# Patient Record
Sex: Female | Born: 1970 | Race: Black or African American | Hispanic: No | Marital: Married | Smoking: Never smoker
Health system: Southern US, Community
[De-identification: ages and names within clinical notes are randomized; demographics above are authoritative.]

## PROBLEM LIST (undated history)

## (undated) DIAGNOSIS — E669 Obesity, unspecified: Secondary | ICD-10-CM

## (undated) DIAGNOSIS — R609 Edema, unspecified: Secondary | ICD-10-CM

## (undated) DIAGNOSIS — E785 Hyperlipidemia, unspecified: Secondary | ICD-10-CM

## (undated) DIAGNOSIS — N760 Acute vaginitis: Secondary | ICD-10-CM

## (undated) DIAGNOSIS — B001 Herpesviral vesicular dermatitis: Secondary | ICD-10-CM

## (undated) DIAGNOSIS — R1013 Epigastric pain: Secondary | ICD-10-CM

## (undated) DIAGNOSIS — K219 Gastro-esophageal reflux disease without esophagitis: Secondary | ICD-10-CM

## (undated) DIAGNOSIS — A6000 Herpesviral infection of urogenital system, unspecified: Secondary | ICD-10-CM

## (undated) HISTORY — DX: Hyperlipidemia, unspecified: E78.5

## (undated) HISTORY — DX: Epigastric pain: R10.13

## (undated) HISTORY — DX: Edema, unspecified: R60.9

## (undated) HISTORY — DX: Herpesviral infection of urogenital system, unspecified: A60.00

## (undated) HISTORY — DX: Acute vaginitis: N76.0

## (undated) HISTORY — PX: WISDOM TOOTH EXTRACTION: SHX21

## (undated) HISTORY — DX: Obesity, unspecified: E66.9

## (undated) HISTORY — DX: Herpesviral vesicular dermatitis: B00.1

---

## 2003-10-02 HISTORY — PX: BREAST BIOPSY: SHX20

## 2005-01-05 ENCOUNTER — Ambulatory Visit: Payer: Self-pay | Admitting: General Surgery

## 2005-12-25 ENCOUNTER — Ambulatory Visit: Payer: Self-pay

## 2007-11-30 HISTORY — PX: OTHER SURGICAL HISTORY: SHX169

## 2007-12-04 ENCOUNTER — Ambulatory Visit: Payer: Self-pay | Admitting: Unknown Physician Specialty

## 2009-10-05 ENCOUNTER — Ambulatory Visit: Payer: Self-pay | Admitting: Unknown Physician Specialty

## 2010-12-21 ENCOUNTER — Ambulatory Visit: Payer: Self-pay | Admitting: Unknown Physician Specialty

## 2012-01-03 ENCOUNTER — Ambulatory Visit: Payer: Self-pay

## 2013-02-24 ENCOUNTER — Ambulatory Visit: Payer: Self-pay

## 2014-03-03 ENCOUNTER — Ambulatory Visit: Payer: Self-pay

## 2015-02-15 ENCOUNTER — Other Ambulatory Visit: Payer: Self-pay | Admitting: Certified Nurse Midwife

## 2015-02-15 DIAGNOSIS — Z1231 Encounter for screening mammogram for malignant neoplasm of breast: Secondary | ICD-10-CM

## 2015-03-08 ENCOUNTER — Ambulatory Visit
Admission: RE | Admit: 2015-03-08 | Discharge: 2015-03-08 | Disposition: A | Discharge status: Home / Self Care | Payer: BC Managed Care – PPO | Source: Ambulatory Visit | Attending: Certified Nurse Midwife | Admitting: Certified Nurse Midwife

## 2015-03-08 DIAGNOSIS — Z1231 Encounter for screening mammogram for malignant neoplasm of breast: Secondary | ICD-10-CM | POA: Insufficient documentation

## 2016-04-13 LAB — HM PAP SMEAR: HM Pap smear: NEGATIVE

## 2016-04-16 ENCOUNTER — Other Ambulatory Visit: Payer: Self-pay | Admitting: Certified Nurse Midwife

## 2016-04-16 DIAGNOSIS — Z1231 Encounter for screening mammogram for malignant neoplasm of breast: Secondary | ICD-10-CM

## 2016-05-04 ENCOUNTER — Ambulatory Visit: Payer: BC Managed Care – PPO

## 2016-05-11 ENCOUNTER — Other Ambulatory Visit: Payer: Self-pay | Admitting: Certified Nurse Midwife

## 2016-05-11 ENCOUNTER — Ambulatory Visit
Admission: RE | Admit: 2016-05-11 | Discharge: 2016-05-11 | Disposition: A | Discharge status: Home / Self Care | Payer: BC Managed Care – PPO | Source: Ambulatory Visit | Attending: Certified Nurse Midwife | Admitting: Certified Nurse Midwife

## 2016-05-11 DIAGNOSIS — Z1231 Encounter for screening mammogram for malignant neoplasm of breast: Secondary | ICD-10-CM | POA: Diagnosis not present

## 2016-05-11 LAB — HM MAMMOGRAPHY

## 2017-04-26 ENCOUNTER — Encounter: Payer: Self-pay | Admitting: Certified Nurse Midwife

## 2017-04-26 ENCOUNTER — Ambulatory Visit: Payer: Self-pay | Admitting: Certified Nurse Midwife

## 2017-05-02 ENCOUNTER — Other Ambulatory Visit: Payer: Self-pay | Admitting: Certified Nurse Midwife

## 2017-05-02 DIAGNOSIS — Z1231 Encounter for screening mammogram for malignant neoplasm of breast: Secondary | ICD-10-CM

## 2017-05-03 ENCOUNTER — Encounter: Payer: Self-pay | Admitting: Certified Nurse Midwife

## 2017-05-03 ENCOUNTER — Ambulatory Visit (INDEPENDENT_AMBULATORY_CARE_PROVIDER_SITE_OTHER): Payer: BC Managed Care – PPO | Admitting: Certified Nurse Midwife

## 2017-05-03 ENCOUNTER — Ambulatory Visit: Payer: Self-pay | Admitting: Certified Nurse Midwife

## 2017-05-03 VITALS — BP 100/64 | HR 84 | Ht 67.0 in | Wt 205.0 lb

## 2017-05-03 DIAGNOSIS — L292 Pruritus vulvae: Secondary | ICD-10-CM | POA: Diagnosis not present

## 2017-05-03 DIAGNOSIS — R1013 Epigastric pain: Secondary | ICD-10-CM

## 2017-05-03 DIAGNOSIS — Z01419 Encounter for gynecological examination (general) (routine) without abnormal findings: Secondary | ICD-10-CM

## 2017-05-03 DIAGNOSIS — Z1231 Encounter for screening mammogram for malignant neoplasm of breast: Secondary | ICD-10-CM | POA: Diagnosis not present

## 2017-05-03 DIAGNOSIS — Z1239 Encounter for other screening for malignant neoplasm of breast: Secondary | ICD-10-CM

## 2017-05-03 DIAGNOSIS — Z1211 Encounter for screening for malignant neoplasm of colon: Secondary | ICD-10-CM | POA: Diagnosis not present

## 2017-05-03 DIAGNOSIS — N912 Amenorrhea, unspecified: Secondary | ICD-10-CM

## 2017-05-03 NOTE — Progress Notes (Signed)
Gynecology Annual Exam  PCP: Dr Ramonita Lab Chief Complaint:  Chief Complaint  Patient presents with  . Gynecologic Exam    History of Present Illness:Roberta Sanchez is a 46 year old African American/Black female, G0 P0000, who presents for her annual exam. She has problems with frequent vaginitis sx. She used boric acid capsules about three times this past year with relief of symptoms. Last used boric acid this week. She would like a refill of Mycolog II to use externally and boric acid. Her menses have been mostly absent on Ocella but she had some spotting last month.  Having some hot flashes. Last  [facility] and LH in 2014 were WNL.   The patient's past medical history is notable for a history of obesity, hyperlipidemia, edema, and chronic vaginitis. Her PCP is Dr Caryl Comes. Since her last annual GYN exam dated 04/13/2016, she has lost 23# by counting calories. Her current BMI=32.11 kg/m2 She is sexually active. She is currently using birth control pills for contraception.  Her most recent pap smear was obtained 04/13/2016 and was negative.  Her most recent mammogram obtained on 05/11/2016 was normal.  There is no family history of breast cancer.  There is no family history of ovarian cancer.  The patient does do monthly self breast exams.  The patient does not smoke.  The patient does not drink alcohol.  The patient does not use illegal drugs.  The patient does exercise by walking The patient does not get adequate calcium in her diet.  She had a recent cholesterol screen in 2017 that was normal.     The patient denies current symptoms of depression.    Review of Systems: Review of Systems  Constitutional: Positive for weight loss (intentional). Negative for chills and fever.  HENT: Negative for congestion, sinus pain and sore throat.   Eyes: Negative for blurred vision and pain.  Respiratory: Negative for hemoptysis, shortness of breath and wheezing.   Cardiovascular: Negative  for chest pain, palpitations and leg swelling.  Gastrointestinal: Positive for heartburn. Negative for abdominal pain, blood in stool, diarrhea, nausea and vomiting.  Genitourinary: Negative for dysuria, frequency, hematuria and urgency.       Positive for amenorrhea  Musculoskeletal: Negative for back pain, joint pain and myalgias.  Skin: Negative for itching and rash.  Neurological: Negative for dizziness, tingling and headaches.  Endo/Heme/Allergies: Negative for environmental allergies and polydipsia. Does not bruise/bleed easily.       Negative for hirsutism   Psychiatric/Behavioral: Negative for depression. The patient is not nervous/anxious and does not have insomnia.     Past Medical History:  Past Medical History:  Diagnosis Date  . Dyspepsia   . Edema   . Hyperlipidemia   . Obesity   . Vulvovaginitis    recurrent monolial vv    Past Surgical History:  Past Surgical History:  Procedure Laterality Date  . BREAST BIOPSY Right 2005   Core Bx -fibroadenoma/ Dr Bary Castilla  . FNA of breast Right 11/2007   benign    Family History:  Family History  Problem Relation Age of Onset  . Hypertension Mother   . Prostate cancer Maternal Uncle   . Breast cancer Neg Hx   . Ovarian cancer Neg Hx   . Diabetes Neg Hx     Social History:  Social History   Social History  . Marital status: Married    Spouse name: N/A  . Number of children: 0  . Years of education:  14   Occupational History  . Patient Coordinator/ [employer]    Social History Main Topics  . Smoking status: Never Smoker  . Smokeless tobacco: Never Used  . Alcohol use No  . Drug use: No  . Sexual activity: Yes    Partners: Male    Birth control/ protection: OCP   Other Topics Concern  . Not on file   Social History Narrative  . No narrative on file    Allergies:  No Known Allergies  Medications:  Current Outpatient Prescriptions:  .  calcium-vitamin D (OSCAL WITH D) 500-200 MG-UNIT tablet, Take  1 tablet by mouth daily with breakfast., Disp: , Rfl:  .  Cyanocobalamin (VITAMIN B 12 PO), Take 500 mcg by mouth daily. , Disp: , Rfl:  .  drospirenone-ethinyl estradiol (YASMIN,ZARAH,SYEDA) 3-0.03 MG tablet, Take by mouth., Disp: , Rfl:  .  hydrochlorothiazide (HYDRODIURIL) 12.5 MG tablet, , Disp: , Rfl:  .  loratadine (CLARITIN) 10 MG tablet, Take 10 mg by mouth daily., Disp: , Rfl:  .  Multiple Vitamin (MULTI-VITAMINS) TABS, Take by mouth., Disp: , Rfl:  .  potassium chloride SA (K-DUR,KLOR-CON) 20 MEQ tablet, Take 10 mEq by mouth daily. , Disp: , Rfl:  Physical Exam Vitals: BP 100/64 (BP Location: Left Arm, Patient Position: Sitting, Cuff Size: Large)   Pulse 84   Ht 5\' 7"  (1.702 m)   Wt 205 lb (93 kg)   BMI 32.11 kg/m   General: BF in NAD HEENT: normocephalic, anicteric Neck: no thyroid enlargement, no palpable nodules, no cervical lymphadenopathy  Pulmonary: No increased work of breathing, CTAB Cardiovascular: RRR, without murmur  Breast: Breast symmetrical, no tenderness, no palpable nodules or masses, no skin or nipple retraction present, no nipple discharge.  No axillary, infraclavicular or supraclavicular lymphadenopathy. Abdomen: Soft, non-tender, non-distended.  Umbilicus without lesions.  No hepatomegaly or masses palpable. No evidence of hernia. Genitourinary:  External: Normal external female genitalia.  Normal urethral meatus, normal Bartholin's and Skene's glands.    Vagina: Normal vaginal mucosa, no evidence of prolapse.    Cervix: Grossly normal in appearance, no bleeding, non-tender  Uterus: Anteverted, deviated to the left, normal size, shape, and consistency, decreased mobility, and non-tender  Adnexa: No adnexal masses, non-tender  Rectal: no masses. Hemoccult negative  Lymphatic: no evidence of inguinal lymphadenopathy Extremities: no edema, erythema, or tenderness Neurologic: Grossly intact Psychiatric: mood appropriate, affect full  Results for orders  placed or performed in visit on 05/03/17 (from the past 24 hour(s))  POCT Occult Blood Stool     Status: Normal   Collection Time: 05/08/17 10:33 PM  Result Value Ref Range   Fecal Occult Blood, POC Negative Negative   Card #1 Date     Card #2 Fecal Occult Blod, POC     Card #2 Date     Card #3 Fecal Occult Blood, POC     Card #3 Date    POCT Wet Prep [facility] [place])     Status: Normal   Collection Time: 05/08/17 10:34 PM  Result Value Ref Range   Source Wet Prep POC vaginal    WBC, Wet Prep HPF POC     Bacteria Wet Prep HPF POC  Few   BACTERIA WET PREP MORPHOLOGY POC     Clue Cells Wet Prep HPF POC None None   Clue Cells Wet Prep Whiff POC     Yeast Wet Prep HPF POC None    KOH Wet Prep POC     Trichomonas Wet  Prep HPF POC Absent Absent     Assessment: 46 y.o. G0P0000 annual gyn exam  Plan:  1) Breast cancer screening - recommend monthly self breast exam and annual mammogram. Mammogram was ordered today. She has a mammogram scheduled for 06/05/17  2) Colon cancer screening discussed. Offered referral for colonoscopy. Would like to do annual hemoccult testing. Test today was negative  3) Cervical cancer screening - Pap not done. ASCCP guidelines and rational discussed.  Patient opts for every 3 years screening interval  4) Contraception discussed testing FSH and LH again since she is essentially amenorrheic on Ocella BCPS. Will return when  Off pills to test. To use condoms until she restarts OCPS and has been on for 1-2 weeks. Refill of Ocella called in.  5) Routine healthcare maintenance including cholesterol and diabetes screening managed by PCP   6) NO evidence of vaginitis today. Refill of Mycolog II and boric acid called in.  Dalia Heading, CNM

## 2017-05-08 ENCOUNTER — Encounter: Payer: Self-pay | Admitting: Certified Nurse Midwife

## 2017-05-08 DIAGNOSIS — R1013 Epigastric pain: Secondary | ICD-10-CM | POA: Insufficient documentation

## 2017-05-08 DIAGNOSIS — E669 Obesity, unspecified: Secondary | ICD-10-CM | POA: Insufficient documentation

## 2017-05-08 DIAGNOSIS — E785 Hyperlipidemia, unspecified: Secondary | ICD-10-CM | POA: Insufficient documentation

## 2017-05-08 DIAGNOSIS — R609 Edema, unspecified: Secondary | ICD-10-CM | POA: Insufficient documentation

## 2017-05-08 LAB — HEMOCCULT GUIAC POC 1CARD (OFFICE): FECAL OCCULT BLD: NEGATIVE

## 2017-05-08 LAB — POCT WET PREP (WET MOUNT): Trichomonas Wet Prep HPF POC: ABSENT

## 2017-06-04 ENCOUNTER — Ambulatory Visit
Admission: RE | Admit: 2017-06-04 | Discharge: 2017-06-04 | Disposition: A | Discharge status: Home / Self Care | Payer: BC Managed Care – PPO | Source: Ambulatory Visit | Attending: Certified Nurse Midwife | Admitting: Certified Nurse Midwife

## 2017-06-04 DIAGNOSIS — Z1231 Encounter for screening mammogram for malignant neoplasm of breast: Secondary | ICD-10-CM | POA: Insufficient documentation

## 2018-01-01 ENCOUNTER — Encounter: Payer: Self-pay | Admitting: Certified Nurse Midwife

## 2018-01-01 ENCOUNTER — Ambulatory Visit: Payer: BC Managed Care – PPO | Admitting: Certified Nurse Midwife

## 2018-01-01 VITALS — BP 108/68 | HR 90 | Ht 67.0 in | Wt 200.0 lb

## 2018-01-01 DIAGNOSIS — N898 Other specified noninflammatory disorders of vagina: Secondary | ICD-10-CM | POA: Diagnosis not present

## 2018-01-01 DIAGNOSIS — N760 Acute vaginitis: Secondary | ICD-10-CM

## 2018-01-01 DIAGNOSIS — L292 Pruritus vulvae: Secondary | ICD-10-CM | POA: Diagnosis not present

## 2018-01-01 MED ORDER — CLOTRIMAZOLE-BETAMETHASONE 1-0.05 % EX CREA: 1.0000 "application " | TOPICAL_CREAM | Freq: Two times a day (BID) | CUTANEOUS | 0 refills | Status: DC

## 2018-01-01 MED ORDER — FLUCONAZOLE 150 MG PO TABS: 150.0000 mg | ORAL_TABLET | Freq: Once | ORAL | 0 refills | Status: AC

## 2018-01-06 DIAGNOSIS — N760 Acute vaginitis: Secondary | ICD-10-CM | POA: Insufficient documentation

## 2018-01-06 LAB — POCT WET PREP (WET MOUNT): TRICHOMONAS WET PREP HPF POC: ABSENT

## 2018-01-06 NOTE — Progress Notes (Signed)
Obstetrics & Gynecology Office Visit   Chief Complaint:  Chief Complaint  Patient presents with  . Vaginitis    History of Present Illness: Roberta Sanchez is a 47 year old African American/Black female, G0 P0000, who presents with vaginitis sx of vaginal discharge and vulvar itching x 2 weeks. The vulvar itching has not responded to boric acid vaginally and Mycolog II externally. She has a history of recurrent vaginitis which usually responds to these treatments. Has not been on antibiotics recently. Has used a new fabric softener on her clothes. Her current form of contraception is Junel  1/20. She is largely amenorrheic on the pill, but does have occasional spotting.    Review of Systems:  ROS -comprehensive ROS is negative except as stated in the HPI.  Past Medical History:  Past Medical History:  Diagnosis Date  . Dyspepsia   . Edema   . Hyperlipidemia   . Obesity   . Vulvovaginitis    recurrent monolial vv    Past Surgical History:  Past Surgical History:  Procedure Laterality Date  . BREAST BIOPSY Right 2005   Core Bx -fibroadenoma/ Dr Bary Castilla  . FNA of breast Right 11/2007   benign    Gynecologic History: No LMP recorded. (Menstrual status: Perimenopausal).  Obstetric History: G0P0000  Family History:  Family History  Problem Relation Age of Onset  . Hypertension Mother   . Prostate cancer Maternal Uncle   . Breast cancer Neg Hx   . Ovarian cancer Neg Hx   . Diabetes Neg Hx     Social History:  Social History   Socioeconomic History  . Marital status: Married    Spouse name: Not on file  . Number of children: 0  . Years of education: 4  . Highest education level: Not on file  Occupational History  . Occupation: Patient Coordinator/ [employer]  Social Needs  . Financial resource strain: Not on file  . Food insecurity:    Worry: Not on file    Inability: Not on file  . Transportation needs:    Medical: Not on file    Non-medical: Not on  file  Tobacco Use  . Smoking status: Never Smoker  . Smokeless tobacco: Never Used  Substance and Sexual Activity  . Alcohol use: No  . Drug use: No  . Sexual activity: Yes    Partners: Male    Birth control/protection: OCP  Lifestyle  . Physical activity:    Days per week: 0 days    Minutes per session: 0 min  . Stress: Not on file  Relationships  . Social connections:    Talks on phone: Not on file    Gets together: Not on file    Attends religious service: Not on file    Active member of club or organization: Not on file    Attends meetings of clubs or organizations: Not on file    Relationship status: Not on file  . Intimate partner violence:    Fear of current or ex partner: Not on file    Emotionally abused: Not on file    Physically abused: Not on file    Forced sexual activity: Not on file  Other Topics Concern  . Not on file  Social History Narrative  . Not on file    Allergies:  No Known Allergies  Medications: Prior to Admission medications   Medication Sig Start Date End Date Taking? Authorizing Provider  calcium-vitamin D (OSCAL WITH D) 500-200  MG-UNIT tablet Take 1 tablet by mouth daily with breakfast.   Yes [provider]  Cyanocobalamin (VITAMIN B 12 PO) Take 500 mcg by mouth daily.    Yes [provider]  hydrochlorothiazide (HYDRODIURIL) 12.5 MG tablet  04/09/17  Yes [provider]  loratadine (CLARITIN) 10 MG tablet Take 10 mg by mouth daily.   Yes [provider]  Multiple Vitamin (MULTI-VITAMINS) TABS Take by mouth.   Yes [provider]  norethindrone-ethinyl estradiol (JUNEL FE,GILDESS FE,LOESTRIN FE) 1-20 MG-MCG tablet  12/21/17  Yes [provider]  omeprazole (PRILOSEC) 20 MG capsule TAKE ONE CAPSULE BY MOUTH TWICE A DAY 07/31/17  Yes [provider]  potassium chloride SA (K-DUR,KLOR-CON) 20 MEQ tablet Take 10 mEq by mouth daily.    Yes [provider]   Physical  Exam Vitals:BP 108/68   Pulse 90   Ht 5\' 7"  (1.702 m)   Wt 200 lb (90.7 kg)   BMI 31.32 kg/m  No LMP recorded. (Menstrual status: Perimenopausal).  Physical Exam  Constitutional: She is oriented to person, place, and time. She appears well-developed and well-nourished. No distress.  Cardiovascular: Normal rate.  Respiratory: Effort normal.  Genitourinary:  Genitourinary Comments: Vulva: no lesions, inflammation, or discharge Vagina: brown discharge, no inflammation Cervix: no lesions   Neurological: She is alert and oriented to person, place, and time.  Skin: Skin is warm and dry.  Psychiatric: She has a normal mood and affect.   Wet prep negative for hyphae, Trich, clue cells  Assessment: 47 y.o. G0P0000 vulvar itching which may be due to an incompletely treated yeast infection or exposure to fabric softeners  Plan: Diflucan 150 mgm x1 Lotrisone BID topically prn itching RTO prn persistent or worsening symptoms.  Dalia Heading, CNM

## 2018-01-10 ENCOUNTER — Telehealth: Payer: Self-pay

## 2018-01-10 NOTE — Telephone Encounter (Signed)
Pt treated for YI by [place] last week. Pt states it cleared up but having symptoms/irritation and feels like it is coming back. CLG told her to call back if she was still having problems. Pt requesting a rf of Diflucan to [facility]. CH#[phone]

## 2018-01-13 NOTE — Telephone Encounter (Signed)
Please advise for refill. Thank you.  

## 2018-01-15 ENCOUNTER — Other Ambulatory Visit: Payer: Self-pay | Admitting: Certified Nurse Midwife

## 2018-01-15 MED ORDER — FLUCONAZOLE 150 MG PO TABS: 150.0000 mg | ORAL_TABLET | Freq: Once | ORAL | 0 refills | Status: AC

## 2018-01-15 NOTE — Telephone Encounter (Signed)
I called in one more Diflucan-but if her symptoms persist, will need to come back. Should still have Lotrisone for external use.

## 2018-01-15 NOTE — Telephone Encounter (Signed)
Pt aware and appreciative  

## 2018-04-28 ENCOUNTER — Other Ambulatory Visit: Payer: Self-pay | Admitting: Certified Nurse Midwife

## 2018-05-09 ENCOUNTER — Encounter: Payer: Self-pay | Admitting: Certified Nurse Midwife

## 2018-05-09 ENCOUNTER — Ambulatory Visit (INDEPENDENT_AMBULATORY_CARE_PROVIDER_SITE_OTHER): Payer: BC Managed Care – PPO | Admitting: Certified Nurse Midwife

## 2018-05-09 VITALS — BP 102/62 | HR 95 | Ht 67.0 in | Wt 199.0 lb

## 2018-05-09 DIAGNOSIS — Z01419 Encounter for gynecological examination (general) (routine) without abnormal findings: Secondary | ICD-10-CM

## 2018-05-09 DIAGNOSIS — Z1211 Encounter for screening for malignant neoplasm of colon: Secondary | ICD-10-CM

## 2018-05-09 DIAGNOSIS — Z1231 Encounter for screening mammogram for malignant neoplasm of breast: Secondary | ICD-10-CM

## 2018-05-09 DIAGNOSIS — Z1239 Encounter for other screening for malignant neoplasm of breast: Secondary | ICD-10-CM

## 2018-05-09 MED ORDER — NYSTATIN-TRIAMCINOLONE 100000-0.1 UNIT/GM-% EX OINT: 1.0000 "application " | TOPICAL_OINTMENT | Freq: Two times a day (BID) | CUTANEOUS | 0 refills | Status: DC | PRN

## 2018-05-09 MED ORDER — BORIC ACID CRYS: CRYSTALS | 5 refills | Status: DC

## 2018-05-09 MED ORDER — NYSTATIN 100000 UNIT/GM EX POWD: Freq: Every day | CUTANEOUS | Status: DC | PRN

## 2018-05-09 NOTE — Progress Notes (Signed)
Gynecology Annual Exam  PCP: Dr Ramonita Lab Chief Complaint:  Chief Complaint  Patient presents with  . Gynecologic Exam    No complaints    History of Present Illness:Roberta Sanchez is a 47 year old African American/Black female, G0 P0000, who presents for her annual exam. She has problems with frequent vaginitis sx. She usually uses boric acid in treatment of these symptoms with relief. In April she was treated for a yeast infection with Diflucan x 2 tabs with relief and has not had vaginitis symptoms since.  She would like a refill of Mycolog II to use externally and boric acid. Her menses are irregular and are lite, consisting of spotting x 1 day on OCPs.    The patient's past medical history is notable for a history of obesity, hyperlipidemia, edema, and chronic vaginitis. Her PCP is Dr Caryl Comes. Since her last annual GYN exam dated 05/03/2017 , she has lost 6 more pounds (29# in the past 2 years) by counting calories. Her current BMI=31.17 kg/m2 She is sexually active. She is currently using birth control pills for contraception.  Her most recent pap smear was obtained 04/13/2016 and was negative.  Her most recent mammogram obtained on 06/04/2017 was normal.  There is no family history of breast cancer.  There is no family history of ovarian cancer.  The patient does do monthly self breast exams.  The patient does not smoke.  The patient does not drink alcohol.  The patient does not use illegal drugs.  The patient does exercise by walking at work The patient does not get adequate calcium in her diet, but does take calcium and vitamin D3 supplements  She had a recent cholesterol screen in 2018 that was normal.    The patient denies current symptoms of depression.    Review of Systems: Review of Systems  Constitutional: Positive for weight loss (intentional). Negative for chills and fever.  HENT: Negative for congestion, sinus pain and sore throat.   Eyes: Negative for blurred  vision and pain.  Respiratory: Negative for hemoptysis, shortness of breath and wheezing.   Cardiovascular: Negative for chest pain, palpitations and leg swelling.  Gastrointestinal: Positive for heartburn. Negative for abdominal pain, blood in stool, diarrhea, nausea and vomiting.  Genitourinary: Negative for dysuria, frequency, hematuria and urgency.       Positive for amenorrhea  Musculoskeletal: Negative for back pain, joint pain and myalgias.  Skin: Negative for itching and rash.  Neurological: Negative for dizziness, tingling and headaches.  Endo/Heme/Allergies: Negative for environmental allergies and polydipsia. Does not bruise/bleed easily.       Negative for hirsutism   Psychiatric/Behavioral: Negative for depression. The patient is not nervous/anxious and does not have insomnia.     Past Medical History:  Past Medical History:  Diagnosis Date  . Dyspepsia   . Edema   . Hyperlipidemia   . Obesity   . Vulvovaginitis    recurrent monolial vv    Past Surgical History:  Past Surgical History:  Procedure Laterality Date  . BREAST BIOPSY Right 2005   Core Bx -fibroadenoma/ Dr Bary Castilla  . FNA of breast Right 11/2007   benign    Family History:  Family History  Problem Relation Age of Onset  . Hypertension Mother   . Prostate cancer Maternal Uncle   . Breast cancer Neg Hx   . Ovarian cancer Neg Hx   . Diabetes Neg Hx     Social History:  Social History  Socioeconomic History  . Marital status: Married    Spouse name: Not on file  . Number of children: 0  . Years of education: 89  . Highest education level: Not on file  Occupational History  . Occupation: Patient Coordinator/ [employer]  Social Needs  . Financial resource strain: Not on file  . Food insecurity:    Worry: Not on file    Inability: Not on file  . Transportation needs:    Medical: Not on file    Non-medical: Not on file  Tobacco Use  . Smoking status: Never Smoker  . Smokeless tobacco:  Never Used  Substance and Sexual Activity  . Alcohol use: No  . Drug use: No  . Sexual activity: Yes    Partners: Male    Birth control/protection: OCP  Lifestyle  . Physical activity:    Days per week: 0 days    Minutes per session: 0 min  . Stress: Not on file  Relationships  . Social connections:    Talks on phone: Not on file    Gets together: Not on file    Attends religious service: Not on file    Active member of club or organization: Not on file    Attends meetings of clubs or organizations: Not on file    Relationship status: Not on file  . Intimate partner violence:    Fear of current or ex partner: Not on file    Emotionally abused: Not on file    Physically abused: Not on file    Forced sexual activity: Not on file  Other Topics Concern  . Not on file  Social History Narrative  . Not on file    Allergies:  No Known Allergies  Medications:  Current Outpatient Medications:  .  calcium-vitamin D (OSCAL WITH D) 500-200 MG-UNIT tablet, Take 1 tablet by mouth daily with breakfast., Disp: , Rfl:  .  clotrimazole-betamethasone (LOTRISONE) cream, Apply 1 application topically 2 (two) times daily., Disp: 30 g, Rfl: 0 .  Cyanocobalamin (VITAMIN B 12 PO), Take 500 mcg by mouth daily. , Disp: , Rfl:  .  hydrochlorothiazide (HYDRODIURIL) 12.5 MG tablet, , Disp: , Rfl:  .  loratadine (CLARITIN) 10 MG tablet, Take 10 mg by mouth daily., Disp: , Rfl:  .  Multiple Vitamin (MULTI-VITAMINS) TABS, Take by mouth., Disp: , Rfl:  .  norethindrone-ethinyl estradiol (JUNEL FE,GILDESS FE,LOESTRIN FE) 1-20 MG-MCG tablet, , Disp: , Rfl:  .  omeprazole (PRILOSEC) 20 MG capsule, TAKE ONE CAPSULE BY MOUTH TWICE A DAY, Disp: , Rfl:  .  potassium chloride SA (K-DUR,KLOR-CON) 20 MEQ tablet, Take 10 mEq by mouth daily. , Disp: , Rfl:  Physical Exam Vitals: BP 102/62 (BP Location: Right Arm, Patient Position: Sitting, Cuff Size: Normal)   Pulse 95   Ht 5\' 7"  (1.702 m)   Wt 199 lb (90.3 kg)    BMI 31.17 kg/m   General: BF in NAD HEENT: normocephalic, anicteric Neck: no thyroid enlargement, no palpable nodules, no cervical lymphadenopathy  Pulmonary: No increased work of breathing, CTAB Cardiovascular: RRR, without murmur  Breast: Breast symmetrical, no tenderness, no palpable nodules or masses, no skin or nipple retraction present, no nipple discharge.  No axillary, infraclavicular or supraclavicular lymphadenopathy. Abdomen: Soft, non-tender, non-distended.  Umbilicus without lesions.  No hepatomegaly or masses palpable. No evidence of hernia. Genitourinary:  External: Normal external female genitalia.  Normal urethral meatus, normal Bartholin's and Skene's glands.    Vagina: Normal vaginal mucosa, no evidence  of prolapse.    Cervix: Grossly normal in appearance, no bleeding, non-tender  Uterus: Retroverted, deviated to the left, normal size, shape, and consistency, decreased mobility, and non-tender  Adnexa: No adnexal masses, non-tender  Rectal: no masses. Hemoccult negative  Lymphatic: no evidence of inguinal lymphadenopathy Extremities: no edema, erythema, or tenderness Neurologic: Grossly intact Psychiatric: mood appropriate, affect full    Assessment: 47 y.o. G0P0000 annual gyn exam  Plan:  1) Breast cancer screening - recommend monthly self breast exam and annual mammogram. Mammogram was ordered today.  2) Colon cancer screening discussed. Offered referral for colonoscopy. Would like to do annual hemoccult testing. Test today was negative  3) Cervical cancer screening - Pap not done. ASCCP guidelines and rational discussed.  Patient opts for every 3 years screening interval. Next Pap due next year.  4) Contraception: Junel 1/20 #3/ RF x 3  5) Routine healthcare maintenance including cholesterol and diabetes screening managed by PCP   6) NO evidence of vaginitis today. Refill of Mycolog II and boric acid called in. Nystop also ordered for under her  breasts.  7) Osteoporosis prevention: Discussed calcium and vitamin D3 requirements. Encouraged weight bearing exercise.  8) RTO 1 year and prn Dalia Heading, CNM

## 2018-05-10 LAB — HEMOCCULT GUIAC POC 1CARD (OFFICE): Fecal Occult Blood, POC: NEGATIVE

## 2018-05-10 MED ORDER — NORETHIN ACE-ETH ESTRAD-FE 1-20 MG-MCG PO TABS: 1.0000 | ORAL_TABLET | Freq: Every day | ORAL | 3 refills | Status: DC

## 2018-06-13 ENCOUNTER — Telehealth: Payer: Self-pay

## 2018-06-13 ENCOUNTER — Other Ambulatory Visit: Payer: Self-pay

## 2018-06-13 MED ORDER — NORETHIN ACE-ETH ESTRAD-FE 1-20 MG-MCG PO TABS: 1.0000 | ORAL_TABLET | Freq: Every day | ORAL | 0 refills | Status: DC

## 2018-06-13 NOTE — Telephone Encounter (Signed)
Pt calling triage line stating she would like CLG to call in Diflucan into [facility], she thinks she has a yeast infection. [place], pt needs an appointment to be evaluated.

## 2018-06-13 NOTE — Telephone Encounter (Signed)
Patient last seen for annual 05/09/18. Patient is requesting refill for Diflucan. Please advise CLG

## 2018-06-17 ENCOUNTER — Telehealth: Payer: Self-pay

## 2018-06-17 NOTE — Telephone Encounter (Signed)
Pt states pharm sent request for diflucan and hasn't heard back.  [phone]  Adv pt it was denied b/c we need to see her.  Tx'd to SP to sched.

## 2018-06-18 ENCOUNTER — Ambulatory Visit (INDEPENDENT_AMBULATORY_CARE_PROVIDER_SITE_OTHER): Payer: BC Managed Care – PPO | Admitting: Obstetrics and Gynecology

## 2018-06-18 ENCOUNTER — Encounter: Payer: Self-pay | Admitting: Obstetrics and Gynecology

## 2018-06-18 VITALS — BP 110/60 | HR 101 | Ht 67.0 in | Wt 205.0 lb

## 2018-06-18 DIAGNOSIS — B3731 Acute candidiasis of vulva and vagina: Secondary | ICD-10-CM

## 2018-06-18 DIAGNOSIS — B373 Candidiasis of vulva and vagina: Secondary | ICD-10-CM

## 2018-06-18 LAB — POCT WET PREP WITH KOH
CLUE CELLS WET PREP PER HPF POC: NEGATIVE
KOH PREP POC: NEGATIVE
Trichomonas, UA: NEGATIVE
Yeast Wet Prep HPF POC: NEGATIVE

## 2018-06-18 MED ORDER — BORIC ACID CRYS
CRYSTALS | 5 refills | Status: DC
Start: 2018-06-18 — End: 2019-05-29

## 2018-06-18 MED ORDER — CLOTRIMAZOLE-BETAMETHASONE 1-0.05 % EX CREA: 1.0000 "application " | TOPICAL_CREAM | Freq: Two times a day (BID) | CUTANEOUS | 0 refills | Status: DC

## 2018-06-18 MED ORDER — FLUCONAZOLE 150 MG PO TABS: 150.0000 mg | ORAL_TABLET | Freq: Once | ORAL | 0 refills | Status: AC

## 2018-06-18 NOTE — Patient Instructions (Signed)
I value your feedback and entrusting us with your care. If you get a [place] patient survey, I would appreciate you taking the time to let us know about your experience today. Thank you! 

## 2018-06-18 NOTE — Progress Notes (Signed)
Patient, No Pcp Per   Chief Complaint  Patient presents with  . Vaginitis    itchiness, no odor, little discharge    HPI:      [name] is a 47 y.o. G0P0000 who LMP was No LMP recorded. (Menstrual status: Perimenopausal)., presents today for vaginitis sx of ext irritation/itch. Had increased d/c initially but that has improved. No odor. Hx of recurrent yeast vag so treats with boric acid supp that usually stop sx, but not helping this time. No urin sx. No recent abx use. Has been in damp underwear though. Uses unscented soap, no dryer sheets. No probiotic use. Treated with diflucan/lotisone crm 4/19 with sx relief. Pt needs boric acid RF.   Past Medical History:  Diagnosis Date  . Dyspepsia   . Edema   . Hyperlipidemia   . Obesity   . Vulvovaginitis    recurrent monolial vv    Past Surgical History:  Procedure Laterality Date  . BREAST BIOPSY Right 2005   Core Bx -fibroadenoma/ Dr Bary Castilla  . FNA of breast Right 11/2007   benign    Family History  Problem Relation Age of Onset  . Hypertension Mother   . Prostate cancer Maternal Uncle   . Breast cancer Neg Hx   . Ovarian cancer Neg Hx   . Diabetes Neg Hx     Social History   Socioeconomic History  . Marital status: Married    Spouse name: Not on file  . Number of children: 0  . Years of education: 86  . Highest education level: Not on file  Occupational History  . Occupation: Patient Coordinator/ [employer]  Social Needs  . Financial resource strain: Not on file  . Food insecurity:    Worry: Not on file    Inability: Not on file  . Transportation needs:    Medical: Not on file    Non-medical: Not on file  Tobacco Use  . Smoking status: Never Smoker  . Smokeless tobacco: Never Used  Substance and Sexual Activity  . Alcohol use: No  . Drug use: No  . Sexual activity: Yes    Partners: Male    Birth control/protection: OCP  Lifestyle  . Physical activity:    Days per week: 0 days   Minutes per session: 0 min  . Stress: Not on file  Relationships  . Social connections:    Talks on phone: Not on file    Gets together: Not on file    Attends religious service: Not on file    Active member of club or organization: Not on file    Attends meetings of clubs or organizations: Not on file    Relationship status: Not on file  . Intimate partner violence:    Fear of current or ex partner: Not on file    Emotionally abused: Not on file    Physically abused: Not on file    Forced sexual activity: Not on file  Other Topics Concern  . Not on file  Social History Narrative  . Not on file    Outpatient Medications Prior to Visit  Medication Sig Dispense Refill  . calcium-vitamin D (OSCAL WITH D) 500-200 MG-UNIT tablet Take 1 tablet by mouth daily with breakfast.    . Cyanocobalamin (VITAMIN B 12 PO) Take 500 mcg by mouth daily.     . hydrochlorothiazide (HYDRODIURIL) 12.5 MG tablet     . loratadine (CLARITIN) 10 MG tablet Take 10 mg by mouth  daily.    . Multiple Vitamin (MULTI-VITAMINS) TABS Take by mouth.    . norethindrone-ethinyl estradiol (JUNEL FE,GILDESS FE,LOESTRIN FE) 1-20 MG-MCG tablet Take 1 tablet by mouth daily. 3 Package 0  . nystatin-triamcinolone ointment (MYCOLOG) Apply 1 application topically 2 (two) times daily as needed. To vulva 30 g 0  . omeprazole (PRILOSEC) 20 MG capsule TAKE ONE CAPSULE BY MOUTH TWICE A DAY    . potassium chloride SA (K-DUR,KLOR-CON) 20 MEQ tablet Take 10 mEq by mouth daily.     . Boric Acid CRYS Place 600 mgm in size 0 capsules vaginally qhs x 7days prn yeast infection symptoms 500 g 5  . clotrimazole-betamethasone (LOTRISONE) cream Apply 1 application topically 2 (two) times daily. 30 g 0   Facility-Administered Medications Prior to Visit  Medication Dose Route Frequency Provider Last Rate Last Dose  . nystatin (MYCOSTATIN/NYSTOP) topical powder   Topical Daily PRN Dalia Heading, CNM         ROS:  Review of Systems    Constitutional: Negative for fever.  Gastrointestinal: Negative for blood in stool, constipation, diarrhea, nausea and vomiting.  Genitourinary: Positive for vaginal discharge. Negative for dyspareunia, dysuria, flank pain, frequency, hematuria, urgency, vaginal bleeding and vaginal pain.  Musculoskeletal: Negative for back pain.  Skin: Negative for rash.   BREAST: No symptoms   OBJECTIVE:   Vitals:  BP 110/60   Pulse (!) 101   Ht 5\' 7"  (1.702 m)   Wt 205 lb (93 kg)   BMI 32.11 kg/m   Physical Exam  Constitutional: She is oriented to person, place, and time. Vital signs are normal. She appears well-developed.  Pulmonary/Chest: Effort normal.  Genitourinary: Vagina normal and uterus normal. There is no rash, tenderness or lesion on the right labia. There is no rash, tenderness or lesion on the left labia. Uterus is not enlarged and not tender. Cervix exhibits no motion tenderness. Right adnexum displays no mass and no tenderness. Left adnexum displays no mass and no tenderness. No erythema or tenderness in the vagina. No vaginal discharge found.  Genitourinary Comments: EXT VAG ERYTHEMA; NO ULCERS/LESIONS  Musculoskeletal: Normal range of motion.  Neurological: She is alert and oriented to person, place, and time.  Psychiatric: She has a normal mood and affect. Her behavior is normal. Thought content normal.  Vitals reviewed.   Results: Results for orders placed or performed in visit on 06/18/18 (from the past 24 hour(s))  POCT Wet Prep with KOH     Status: Normal   Collection Time: 06/18/18  4:52 PM  Result Value Ref Range   Trichomonas, UA Negative    Clue Cells Wet Prep HPF POC neg    Epithelial Wet Prep HPF POC     Yeast Wet Prep HPF POC neg    Bacteria Wet Prep HPF POC     RBC Wet Prep HPF POC     WBC Wet Prep HPF POC     KOH Prep POC Negative Negative    Assessment/Plan: Candidal vaginitis - Pos sx and exam/ neg wet prep. Rx lotrisone crm/diflucan. Add  probtiotics. F/u prn. Rx RF boric acid for future sx.  - Plan: POCT Wet Prep with KOH, clotrimazole-betamethasone (LOTRISONE) cream, Boric Acid CRYS, fluconazole (DIFLUCAN) 150 MG tablet    Meds ordered this encounter  Medications  . clotrimazole-betamethasone (LOTRISONE) cream    Sig: Apply 1 application topically 2 (two) times daily. Prn sx up to 2 wks    Dispense:  45 g    Refill:  0    Order Specific Question:   Supervising Provider    Answer:   Gae Dry [[id number]]  . Boric Acid CRYS    Sig: Place 600 mgm in size 0 capsules vaginally qhs x 7days prn yeast infection symptoms    Dispense:  500 g    Refill:  5    Order Specific Question:   Supervising Provider    Answer:   Gae Dry [id number]  . fluconazole (DIFLUCAN) 150 MG tablet    Sig: Take 1 tablet (150 mg total) by mouth once for 1 dose.    Dispense:  1 tablet    Refill:  0    Order Specific Question:   Supervising Provider    Answer:   Gae Dry [[id number]]      Return if symptoms worsen or fail to improve.  Jimma Ortman B. Samanatha Brammer, PA-C 06/18/2018 4:54 PM

## 2018-06-24 NOTE — Telephone Encounter (Signed)
Seen by PA Copland for symptoms 9/18 while I was on vacation. Dalia Heading, CNM

## 2018-07-08 ENCOUNTER — Ambulatory Visit
Admission: RE | Admit: 2018-07-08 | Discharge: 2018-07-08 | Disposition: A | Discharge status: Home / Self Care | Payer: BC Managed Care – PPO | Source: Ambulatory Visit | Attending: Certified Nurse Midwife | Admitting: Certified Nurse Midwife

## 2018-07-08 DIAGNOSIS — Z1239 Encounter for other screening for malignant neoplasm of breast: Secondary | ICD-10-CM | POA: Diagnosis present

## 2018-11-01 DIAGNOSIS — A6000 Herpesviral infection of urogenital system, unspecified: Secondary | ICD-10-CM

## 2018-11-01 HISTORY — DX: Herpesviral infection of urogenital system, unspecified: A60.00

## 2018-11-27 ENCOUNTER — Encounter: Payer: Self-pay | Admitting: Obstetrics and Gynecology

## 2018-11-27 ENCOUNTER — Ambulatory Visit (INDEPENDENT_AMBULATORY_CARE_PROVIDER_SITE_OTHER): Payer: BC Managed Care – PPO | Admitting: Obstetrics and Gynecology

## 2018-11-27 VITALS — BP 100/74 | HR 95 | Ht 67.0 in | Wt 210.0 lb

## 2018-11-27 DIAGNOSIS — N898 Other specified noninflammatory disorders of vagina: Secondary | ICD-10-CM

## 2018-11-27 LAB — POCT WET PREP WITH KOH
CLUE CELLS WET PREP PER HPF POC: NEGATIVE
KOH PREP POC: NEGATIVE
TRICHOMONAS UA: NEGATIVE
Yeast Wet Prep HPF POC: NEGATIVE

## 2018-11-27 NOTE — Patient Instructions (Signed)
I value your feedback and entrusting us with your care. If you get a [facility] patient survey, I would appreciate you taking the time to let us know about your experience today. Thank you! 

## 2018-11-27 NOTE — Progress Notes (Signed)
Roberta Hector, MD   Chief Complaint  Patient presents with  . Vaginitis    discharge and itchiness, no odor/irritation x 2 weeks, hemorrhoids too    HPI:      Roberta Sanchez is a 48 y.o. G0P0000 who LMP was No LMP recorded. (Menstrual status: Perimenopausal)., presents today for vaginal itching/d/c for the past 2 wks, no fishy odor. Hx of yeast vag in past. No recent abx use. Has been treating with OTC yeast crm without relief, and itch got really bad this wk. Tried boric acid but that caused more irritation. Had sex with husband over wknd and sx worse since then. Pt had flu like sx about 2 wks ago. Hx of cold sores. Also has occas rectal itch, no ext hemorrhoids, no rectal bleeding, constipation, diarrhea. Has daily BMs.   Past Medical History:  Diagnosis Date  . Cold sore   . Dyspepsia   . Edema   . Hyperlipidemia   . Obesity   . Vulvovaginitis    recurrent monolial vv    Past Surgical History:  Procedure Laterality Date  . BREAST BIOPSY Right 2005   Core Bx -fibroadenoma/ Dr Bary Castilla  . FNA of breast Right 11/2007   benign    Family History  Problem Relation Age of Onset  . Hypertension Mother   . Prostate cancer Maternal Uncle        about 65, tested  . Lung cancer Maternal Uncle   . Breast cancer Neg Hx   . Ovarian cancer Neg Hx   . Diabetes Neg Hx     Social History   Socioeconomic History  . Marital status: Married    Spouse name: Not on file  . Number of children: 0  . Years of education: 81  . Highest education level: Not on file  Occupational History  . Occupation: Patient Coordinator/ [employer]  Social Needs  . Financial resource strain: Not on file  . Food insecurity:    Worry: Not on file    Inability: Not on file  . Transportation needs:    Medical: Not on file    Non-medical: Not on file  Tobacco Use  . Smoking status: Never Smoker  . Smokeless tobacco: Never Used  Substance and Sexual Activity  . Alcohol use: No  . Drug  use: No  . Sexual activity: Yes    Partners: Male    Birth control/protection: OCP  Lifestyle  . Physical activity:    Days per week: 0 days    Minutes per session: 0 min  . Stress: Not on file  Relationships  . Social connections:    Talks on phone: Not on file    Gets together: Not on file    Attends religious service: Not on file    Active member of club or organization: Not on file    Attends meetings of clubs or organizations: Not on file    Relationship status: Not on file  . Intimate partner violence:    Fear of current or ex partner: Not on file    Emotionally abused: Not on file    Physically abused: Not on file    Forced sexual activity: Not on file  Other Topics Concern  . Not on file  Social History Narrative  . Not on file    Outpatient Medications Prior to Visit  Medication Sig Dispense Refill  . Boric Acid CRYS Place 600 mgm in size 0 capsules vaginally qhs x  7days prn yeast infection symptoms 500 g 5  . calcium-vitamin D (OSCAL WITH D) 500-200 MG-UNIT tablet Take 1 tablet by mouth daily with breakfast.    . clotrimazole-betamethasone (LOTRISONE) cream Apply 1 application topically 2 (two) times daily. Prn sx up to 2 wks 45 g 0  . Cyanocobalamin (VITAMIN B 12 PO) Take 500 mcg by mouth daily.     . hydrochlorothiazide (HYDRODIURIL) 12.5 MG tablet     . hydrocortisone 2.5 % cream Apply topically.    Marland Kitchen loratadine (CLARITIN) 10 MG tablet Take 10 mg by mouth daily.    . Multiple Vitamin (MULTI-VITAMINS) TABS Take by mouth.    . norethindrone-ethinyl estradiol (JUNEL FE,GILDESS FE,LOESTRIN FE) 1-20 MG-MCG tablet Take 1 tablet by mouth daily. 3 Package 0  . nystatin-triamcinolone ointment (MYCOLOG) Apply 1 application topically 2 (two) times daily as needed. To vulva 30 g 0  . omeprazole (PRILOSEC) 20 MG capsule TAKE ONE CAPSULE BY MOUTH TWICE A DAY    . potassium chloride SA (K-DUR,KLOR-CON) 20 MEQ tablet Take 10 mEq by mouth daily.      Facility-Administered  Medications Prior to Visit  Medication Dose Route Frequency Provider Last Rate Last Dose  . nystatin (MYCOSTATIN/NYSTOP) topical powder   Topical Daily PRN Dalia Heading, CNM          ROS:  Review of Systems  Constitutional: Negative for fever.  Gastrointestinal: Negative for blood in stool, constipation, diarrhea, nausea and vomiting.  Genitourinary: Positive for vaginal discharge. Negative for dyspareunia, dysuria, flank pain, frequency, hematuria, urgency, vaginal bleeding and vaginal pain.  Musculoskeletal: Negative for back pain.  Skin: Negative for rash.    OBJECTIVE:   Vitals:  BP 100/74   Pulse 95   Ht 5\' 7"  (1.702 m)   Wt 210 lb (95.3 kg)   BMI 32.89 kg/m   Physical Exam Vitals signs reviewed.  Constitutional:      Appearance: She is well-developed.  Pulmonary:     Effort: Pulmonary effort is normal.  Genitourinary:    Pubic Area: No rash.      Labia:        Right: Lesion present. No rash or tenderness.        Left: No rash, tenderness or lesion.      Vagina: Normal. No vaginal discharge, erythema or tenderness.     Cervix: Normal.     Uterus: Normal. Not enlarged and not tender.      Adnexa: Right adnexa normal and left adnexa normal.       Right: No mass or tenderness.         Left: No mass or tenderness.       Rectum: No tenderness, anal fissure or external hemorrhoid.       Comments: NEG RECTAL EXAM EXCEPT FOR A FEW SMALL SKIN TAGS Musculoskeletal: Normal range of motion.  Lymphadenopathy:     Lower Body: No right inguinal adenopathy. No left inguinal adenopathy.  Neurological:     Mental Status: She is alert and oriented to person, place, and time.  Psychiatric:        Behavior: Behavior normal.        Thought Content: Thought content normal.     Results: Results for orders placed or performed in visit on 11/27/18 (from the past 24 hour(s))  POCT Wet Prep with KOH     Status: Normal   Collection Time: 11/27/18  4:00 PM  Result Value  Ref Range   Trichomonas, UA Negative  Clue Cells Wet Prep HPF POC NEG    Epithelial Wet Prep HPF POC     Yeast Wet Prep HPF POC NEG    Bacteria Wet Prep HPF POC     RBC Wet Prep HPF POC     WBC Wet Prep HPF POC     KOH Prep POC Negative Negative     Assessment/Plan: Vaginal lesion - Question herpes by clinical exam. Check culture. If neg (lesions small and older), check HSV 2 IgG. Will f/u with results. Hx of HSV 1 - Plan: Other/Misc lab test  Vaginal itching - Neg wet prep. Most likely due to vag lesion. Cold compresses/sitz baths/NSAIDs/hydrocortisone crm. F/u prn.  - Plan: POCT Wet Prep with KOH    Return if symptoms worsen or fail to improve.  Roberta Allender B. Ky Rumple, PA-C 11/27/2018 4:05 PM

## 2018-12-03 ENCOUNTER — Encounter: Payer: Self-pay | Admitting: Obstetrics and Gynecology

## 2018-12-03 ENCOUNTER — Telehealth: Payer: Self-pay | Admitting: Obstetrics and Gynecology

## 2018-12-03 DIAGNOSIS — A6004 Herpesviral vulvovaginitis: Secondary | ICD-10-CM

## 2018-12-03 MED ORDER — VALACYCLOVIR HCL 500 MG PO TABS: 500.0000 mg | ORAL_TABLET | Freq: Every day | ORAL | 1 refills | Status: DC

## 2018-12-03 NOTE — Telephone Encounter (Signed)
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 Sx improved. Discussed episodic vs daily tx. Pt would like daily valtrex. Rx eRxd. Recommended husband do HSV 2 IgG testing.  Pt's questions answered. F/u prn.

## 2018-12-29 ENCOUNTER — Telehealth: Payer: Self-pay

## 2019-01-15 NOTE — Telephone Encounter (Signed)
Error

## 2019-05-01 ENCOUNTER — Other Ambulatory Visit: Payer: Self-pay

## 2019-05-01 ENCOUNTER — Telehealth: Payer: Self-pay | Admitting: Certified Nurse Midwife

## 2019-05-01 MED ORDER — NORETHIN ACE-ETH ESTRAD-FE 1-20 MG-MCG PO TABS: 1.0000 | ORAL_TABLET | Freq: Every day | ORAL | 0 refills | Status: DC

## 2019-05-01 NOTE — Telephone Encounter (Signed)
Patient is schedule for 05/29/19 for annual. Patient is completely out of birth control would like refill sent in today. Please

## 2019-05-01 NOTE — Telephone Encounter (Signed)
RF sent, pt aware.

## 2019-05-28 NOTE — Progress Notes (Signed)
Gynecology Annual Exam  PCP: Dr Ramonita Lab Chief Complaint:  Chief Complaint  Patient presents with  . Gynecologic Exam    hepes II; taken med; no sex since.    History of Present Illness:Roberta Sanchez is a 48 year old African American/Black female, G0 P0000, who presents for her annual exam. She has problems with frequent vaginitis sx. She usually uses boric acid with relief.  Her menses are irregular and are lite, consisting of spotting x 1 day on OCPs.    The patient's past medical history is notable for a history of obesity, hyperlipidemia, edema, and chronic vaginitis. Her PCP is Dr Caryl Comes. Since her last annual GYN exam dated 05/09/2018 , she had a primary HSV outbreak. 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 Has had no further outbreaks. Has not had sex since then either.  She is sexually active. She is currently using birth control pills for contraception.  Her most recent pap smear was obtained 04/13/2016 and was negative.  Her most recent mammogram obtained on 07/08/2018 was normal.  There is no family history of breast cancer.  There is no family history of ovarian cancer.  The patient does do monthly self breast exams.  The patient does not smoke.  The patient does not drink alcohol.  The patient does not use illegal drugs.  The patient does exercise by walking 5 miles a day The patient does not get adequate calcium in her diet, but does take calcium and vitamin D3 supplements  She had a recent cholesterol screen in 2019 that was normal.    The patient denies current symptoms of depression.    Review of Systems: Review of Systems  Constitutional: Negative for chills and fever.  HENT: Negative for congestion, sinus pain and sore throat.   Eyes: Negative for blurred vision and pain.  Respiratory: Negative for hemoptysis, shortness of breath and wheezing.   Cardiovascular: Negative for chest pain, palpitations and leg  swelling.  Gastrointestinal: Positive for heartburn. Negative for abdominal pain, blood in stool, diarrhea, nausea and vomiting.  Genitourinary: Negative for dysuria, frequency, hematuria and urgency.       Positive for amenorrhea  Musculoskeletal: Negative for back pain, joint pain and myalgias.  Skin: Negative for itching and rash.  Neurological: Negative for dizziness, tingling and headaches.  Endo/Heme/Allergies: Positive for environmental allergies. Negative for polydipsia. Does not bruise/bleed easily.       Negative for hirsutism   Psychiatric/Behavioral: Negative for depression. The patient is not nervous/anxious and does not have insomnia.     Past Medical History:  Past Medical History:  Diagnosis Date  . Cold sore   . Dyspepsia   . Edema   . 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 Hyperlipidemia   . Obesity   . Vulvovaginitis    recurrent monolial vv    Past Surgical History:  Past Surgical History:  Procedure Laterality Date  . BREAST BIOPSY Right 2005   Core Bx -fibroadenoma/ Dr Bary Castilla  . FNA of breast Right 11/2007   benign    Family History:  Family History  Problem Relation Age of Onset  . Hypertension Mother   . Prostate cancer Maternal Uncle        about 83, tested  . Lung cancer Maternal Uncle   . Dementia Father   . Breast cancer Neg Hx   . Ovarian cancer Neg Hx   . Diabetes  Neg Hx     Social History:  Social History   Socioeconomic History  . Marital status: Married    Spouse name: Not on file  . Number of children: 0  . Years of education: 63  . Highest education level: Not on file  Occupational History  . Occupation: Patient Coordinator/ [employer]  Social Needs  . Financial resource strain: Not on file  . Food insecurity    Worry: Not on file    Inability: Not on file  . Transportation needs    Medical: Not on file    Non-medical: Not on file  Tobacco Use  . Smoking status: Never Smoker  . Smokeless tobacco: Never Used   Substance and Sexual Activity  . Alcohol use: No  . Drug use: No  . Sexual activity: Not Currently    Partners: Male    Birth control/protection: OCP, Pill  Lifestyle  . Physical activity    Days per week: 0 days    Minutes per session: 0 min  . Stress: Not on file  Relationships  . Social Herbalist on phone: Not on file    Gets together: Not on file    Attends religious service: Not on file    Active member of club or organization: Not on file    Attends meetings of clubs or organizations: Not on file    Relationship status: Not on file  . Intimate partner violence    Fear of current or ex partner: Not on file    Emotionally abused: Not on file    Physically abused: Not on file    Forced sexual activity: Not on file  Other Topics Concern  . Not on file  Social History Narrative  . Not on file    Allergies:  No Known Allergies  Medications:  Current Outpatient Medications:  .  Boric Acid CRYS, Place 600 mgm in size 0 capsules vaginally qhs x 7days prn yeast infection symptoms, Disp: 500 g, Rfl: 5 .  calcium-vitamin D (OSCAL WITH D) 500-200 MG-UNIT tablet, Take 1 tablet by mouth daily with breakfast., Disp: , Rfl:  .  clotrimazole-betamethasone (LOTRISONE) cream, Apply 1 application topically 2 (two) times daily. Prn sx up to 2 wks, Disp: 45 g, Rfl: 0 .  Cyanocobalamin (VITAMIN B 12 PO), Take 500 mcg by mouth daily. , Disp: , Rfl:  .  hydrochlorothiazide (HYDRODIURIL) 12.5 MG tablet, , Disp: , Rfl:  .  hydrocortisone 2.5 % cream, Apply topically., Disp: , Rfl:  .  loratadine (CLARITIN) 10 MG tablet, Take 10 mg by mouth daily., Disp: , Rfl:  .  Multiple Vitamin (MULTI-VITAMINS) TABS, Take by mouth., Disp: , Rfl:  .  norethindrone-ethinyl estradiol (LOESTRIN FE) 1-20 MG-MCG tablet, Take 1 tablet by mouth daily., Disp: 3 Package, Rfl: 0 .  nystatin-triamcinolone ointment (MYCOLOG), Apply 1 application topically 2 (two) times daily as needed. To vulva, Disp: 30 g,  Rfl: 0 .  omeprazole (PRILOSEC) 20 MG capsule, TAKE ONE CAPSULE BY MOUTH TWICE A DAY, Disp: , Rfl:  .  potassium chloride SA (K-DUR,KLOR-CON) 20 MEQ tablet, Take 10 mEq by mouth daily. , Disp: , Rfl:  .  Sulfacetamide Sodium, Acne, 10 % LOTN, Apply 1 application topically 2 (two) times daily., Disp: , Rfl:  .  valACYclovir (VALTREX) 500 MG tablet, Take 1 tablet (500 mg total) by mouth daily., Disp: 90 tablet, Rfl: 1 .  nystatin (MYCOSTATIN/NYSTOP) topical powder, , Topical under breasts, Daily PRN, Dalia Heading,  CNM Physical Exam Vitals: BP 120/80   Pulse 78   Ht 5' 7.5" (1.715 m)   Wt 199 lb (90.3 kg)   BMI 30.71 kg/m   General: BF in NAD HEENT: normocephalic, anicteric Neck: no thyroid enlargement, no palpable nodules, no cervical lymphadenopathy  Pulmonary: No increased work of breathing, CTAB Cardiovascular: RRR, without murmur  Breast: Breast symmetrical, no tenderness, no palpable nodules or masses, no skin or nipple retraction present, no nipple discharge.  No axillary, infraclavicular or supraclavicular lymphadenopathy. Abdomen: Soft, non-tender, non-distended.  Umbilicus without lesions.  No hepatomegaly or masses palpable. No evidence of hernia. Genitourinary:  External: Normal external female genitalia.  Normal urethral meatus, normal Bartholin's and Skene's glands.    Vagina: Normal vaginal mucosa, no evidence of prolapse.    Cervix: Grossly normal in appearance, no bleeding, non-tender  Uterus: Retroverted, deviated to the left, normal size, shape, and consistency, decreased mobility, and non-tender  Adnexa: No adnexal masses, non-tender  Rectal: no masses. Hemoccult negative  Lymphatic: no evidence of inguinal lymphadenopathy Extremities: no edema, erythema, or tenderness Neurologic: Grossly intact Psychiatric: mood appropriate, affect full    Assessment: 48 y.o. G0P0000 annual gyn exam  Plan:  1) Breast cancer screening - recommend monthly self breast exam  and annual mammogram. Mammogram was ordered today.  2) Colon cancer screening discussed. Offered referral for colonoscopy. Would like to do annual hemoccult testing. Test today was negative  3) Cervical cancer screening - Pap done. ASCCP guidelines and rational discussed.  Patient opts for every 3 years screening interval.   4) Contraception: Junel 1/20 #3/ RF x 3  5) Routine healthcare maintenance including cholesterol and diabetes screening managed by PCP   6) Refill of Nystop, Valtrex, and boric acid sent in  7) Osteoporosis prevention: Discussed calcium (advised to switch to calcium citrate) and vitamin D3 requirements. Encouraged weight bearing exercise.  8) RTO 1 year and prn Dalia Heading, CNM

## 2019-05-29 ENCOUNTER — Other Ambulatory Visit (HOSPITAL_COMMUNITY)
Admission: RE | Admit: 2019-05-29 | Discharge: 2019-05-29 | Disposition: A | Discharge status: Home / Self Care | Payer: BC Managed Care – PPO | Source: Ambulatory Visit | Attending: Certified Nurse Midwife | Admitting: Certified Nurse Midwife

## 2019-05-29 ENCOUNTER — Encounter: Payer: Self-pay | Admitting: Certified Nurse Midwife

## 2019-05-29 ENCOUNTER — Ambulatory Visit (INDEPENDENT_AMBULATORY_CARE_PROVIDER_SITE_OTHER): Payer: BC Managed Care – PPO | Admitting: Certified Nurse Midwife

## 2019-05-29 ENCOUNTER — Other Ambulatory Visit: Payer: Self-pay

## 2019-05-29 VITALS — BP 120/80 | HR 78 | Ht 67.5 in | Wt 199.0 lb

## 2019-05-29 DIAGNOSIS — Z1239 Encounter for other screening for malignant neoplasm of breast: Secondary | ICD-10-CM

## 2019-05-29 DIAGNOSIS — Z124 Encounter for screening for malignant neoplasm of cervix: Secondary | ICD-10-CM

## 2019-05-29 DIAGNOSIS — A6004 Herpesviral vulvovaginitis: Secondary | ICD-10-CM

## 2019-05-29 DIAGNOSIS — Z1211 Encounter for screening for malignant neoplasm of colon: Secondary | ICD-10-CM | POA: Diagnosis not present

## 2019-05-29 DIAGNOSIS — A6000 Herpesviral infection of urogenital system, unspecified: Secondary | ICD-10-CM

## 2019-05-29 DIAGNOSIS — B373 Candidiasis of vulva and vagina: Secondary | ICD-10-CM

## 2019-05-29 DIAGNOSIS — Z01419 Encounter for gynecological examination (general) (routine) without abnormal findings: Secondary | ICD-10-CM | POA: Diagnosis not present

## 2019-05-29 DIAGNOSIS — B3731 Acute candidiasis of vulva and vagina: Secondary | ICD-10-CM

## 2019-05-29 LAB — HEMOCCULT GUIAC POC 1CARD (OFFICE): Fecal Occult Blood, POC: NEGATIVE

## 2019-05-29 MED ORDER — NYSTATIN 100000 UNIT/GM EX POWD: Freq: Two times a day (BID) | CUTANEOUS | 0 refills | Status: DC | PRN

## 2019-05-29 MED ORDER — NORETHIN ACE-ETH ESTRAD-FE 1-20 MG-MCG PO TABS: 1.0000 | ORAL_TABLET | Freq: Every day | ORAL | 3 refills | Status: DC

## 2019-05-29 MED ORDER — VALACYCLOVIR HCL 500 MG PO TABS: 500.0000 mg | ORAL_TABLET | Freq: Every day | ORAL | 3 refills | Status: DC

## 2019-05-29 MED ORDER — BORIC ACID CRYS: CRYSTALS | 6 refills | Status: DC

## 2019-06-01 LAB — CYTOLOGY - PAP
Adequacy: ABSENT
Diagnosis: NEGATIVE

## 2019-07-31 ENCOUNTER — Ambulatory Visit
Admission: RE | Admit: 2019-07-31 | Discharge: 2019-07-31 | Disposition: A | Discharge status: Home / Self Care | Payer: BC Managed Care – PPO | Source: Ambulatory Visit | Attending: Certified Nurse Midwife | Admitting: Certified Nurse Midwife

## 2019-07-31 DIAGNOSIS — Z1231 Encounter for screening mammogram for malignant neoplasm of breast: Secondary | ICD-10-CM | POA: Insufficient documentation

## 2019-07-31 DIAGNOSIS — Z1239 Encounter for other screening for malignant neoplasm of breast: Secondary | ICD-10-CM

## 2019-12-15 DIAGNOSIS — E7800 Pure hypercholesterolemia, unspecified: Secondary | ICD-10-CM | POA: Insufficient documentation

## 2020-04-08 ENCOUNTER — Other Ambulatory Visit: Payer: Self-pay | Admitting: Certified Nurse Midwife

## 2020-06-03 ENCOUNTER — Telehealth: Payer: Self-pay

## 2020-06-03 ENCOUNTER — Ambulatory Visit: Payer: BC Managed Care – PPO | Admitting: Certified Nurse Midwife

## 2020-06-03 ENCOUNTER — Ambulatory Visit: Payer: BC Managed Care – PPO | Admitting: Obstetrics and Gynecology

## 2020-06-03 DIAGNOSIS — A6004 Herpesviral vulvovaginitis: Secondary | ICD-10-CM

## 2020-06-03 MED ORDER — VALACYCLOVIR HCL 500 MG PO TABS: 500.0000 mg | ORAL_TABLET | Freq: Every day | ORAL | 3 refills | Status: DC

## 2020-06-03 NOTE — Telephone Encounter (Signed)
Pt calling; pharm has sent 3 requests with no response; is out of medication; has appt end of the month.  [phone]  Left detailed msg for pt to call with name of medication and confirm pharm.

## 2020-06-03 NOTE — Telephone Encounter (Signed)
Okay to refill? 

## 2020-06-03 NOTE — Telephone Encounter (Signed)
Pt states she needs her valtrex 90d supply refilled; she takes it daily and is out. [facility]. Has appt c [facility] 06/24/20.  Adv will send msg to [facility].

## 2020-06-03 NOTE — Telephone Encounter (Signed)
Left detailed msg for pt that rx has been eRx'd.

## 2020-06-24 ENCOUNTER — Encounter: Payer: Self-pay | Admitting: Obstetrics and Gynecology

## 2020-06-24 ENCOUNTER — Ambulatory Visit (INDEPENDENT_AMBULATORY_CARE_PROVIDER_SITE_OTHER): Payer: BC Managed Care – PPO | Admitting: Obstetrics and Gynecology

## 2020-06-24 ENCOUNTER — Other Ambulatory Visit: Payer: Self-pay

## 2020-06-24 VITALS — BP 108/70 | Ht 68.0 in | Wt 214.0 lb

## 2020-06-24 DIAGNOSIS — N838 Other noninflammatory disorders of ovary, fallopian tube and broad ligament: Secondary | ICD-10-CM

## 2020-06-24 DIAGNOSIS — Z113 Encounter for screening for infections with a predominantly sexual mode of transmission: Secondary | ICD-10-CM

## 2020-06-24 DIAGNOSIS — Z124 Encounter for screening for malignant neoplasm of cervix: Secondary | ICD-10-CM | POA: Diagnosis not present

## 2020-06-24 DIAGNOSIS — Z0000 Encounter for general adult medical examination without abnormal findings: Secondary | ICD-10-CM

## 2020-06-24 DIAGNOSIS — Z3041 Encounter for surveillance of contraceptive pills: Secondary | ICD-10-CM

## 2020-06-24 DIAGNOSIS — Z1231 Encounter for screening mammogram for malignant neoplasm of breast: Secondary | ICD-10-CM

## 2020-06-24 DIAGNOSIS — Z1211 Encounter for screening for malignant neoplasm of colon: Secondary | ICD-10-CM

## 2020-06-24 DIAGNOSIS — Z131 Encounter for screening for diabetes mellitus: Secondary | ICD-10-CM

## 2020-06-24 DIAGNOSIS — Z01419 Encounter for gynecological examination (general) (routine) without abnormal findings: Secondary | ICD-10-CM | POA: Diagnosis not present

## 2020-06-24 DIAGNOSIS — Z13220 Encounter for screening for lipoid disorders: Secondary | ICD-10-CM

## 2020-06-24 DIAGNOSIS — Z1329 Encounter for screening for other suspected endocrine disorder: Secondary | ICD-10-CM

## 2020-06-24 MED ORDER — NORETHIN ACE-ETH ESTRAD-FE 1-20 MG-MCG PO TABS: 1.0000 | ORAL_TABLET | Freq: Every day | ORAL | 3 refills | Status: DC

## 2020-06-24 NOTE — Patient Instructions (Signed)
[employer] Recommended Dietary Allowances for Calcium and Vitamin D  Age (yr) Calcium Recommended Dietary Allowance (mg/day) Vitamin D Recommended Dietary Allowance (international units/day)  9-18 1,300 600  19-50 1,000 600  51-70 1,200 600  71 and older 1,200 800  Data from [employer]. Dietary reference intakes: calcium, vitamin D. [place], [place]: [employer] Academies [employer]; 2011.     Exercising to Stay Healthy To become healthy and stay healthy, it is recommended that you do moderate-intensity and vigorous-intensity exercise. You can tell that you are exercising at a moderate intensity if your heart starts beating faster and you start breathing faster but can still hold a conversation. You can tell that you are exercising at a vigorous intensity if you are breathing much harder and faster and cannot hold a conversation while exercising. Exercising regularly is important. It has many health benefits, such as:  Improving overall fitness, flexibility, and endurance.  Increasing bone density.  Helping with weight control.  Decreasing body fat.  Increasing muscle strength.  Reducing stress and tension.  Improving overall health. How often should I exercise? Choose an activity that you enjoy, and set realistic goals. Your health care provider can help you make an activity plan that works for you. Exercise regularly as told by your health care provider. This may include:  Doing strength training two times a week, such as: ? Lifting weights. ? Using resistance bands. ? Push-ups. ? Sit-ups. ? Yoga.  Doing a certain intensity of exercise for a given amount of time. Choose from these options: ? A total of 150 minutes of moderate-intensity exercise every week. ? A total of 75 minutes of vigorous-intensity exercise every week. ? A mix of moderate-intensity and vigorous-intensity exercise every week. Children, pregnant women, people who have not exercised  regularly, people who are overweight, and older adults may need to talk with a health care provider about what activities are safe to do. If you have a medical condition, be sure to talk with your health care provider before you start a new exercise program. What are some exercise ideas? Moderate-intensity exercise ideas include:  Walking 1 mile (1.6 km) in about 15 minutes.  Biking.  Hiking.  Golfing.  Dancing.  Water aerobics. Vigorous-intensity exercise ideas include:  Walking 4.5 miles (7.2 km) or more in about 1 hour.  Jogging or running 5 miles (8 km) in about 1 hour.  Biking 10 miles (16.1 km) or more in about 1 hour.  Lap swimming.  Roller-skating or in-line skating.  Cross-country skiing.  Vigorous competitive sports, such as football, basketball, and soccer.  Jumping rope.  Aerobic dancing. What are some everyday activities that can help me to get exercise?  Yard work, such as: ? Pushing a lawn mower. ? Raking and bagging leaves.  Washing your car.  Pushing a stroller.  Shoveling snow.  Gardening.  Washing windows or floors. How can I be more active in my day-to-day activities?  Use stairs instead of an elevator.  Take a walk during your lunch break.  If you drive, park your car farther away from your work or school.  If you take public transportation, get off one stop early and walk the rest of the way.  Stand up or walk around during all of your indoor phone calls.  Get up, stretch, and walk around every 30 minutes throughout the day.  Enjoy exercise with a friend. Support to continue exercising will help you keep a regular routine of activity. What guidelines can   I follow while exercising?  Before you start a new exercise program, talk with your health care provider.  Do not exercise so much that you hurt yourself, feel dizzy, or get very short of breath.  Wear comfortable clothes and wear shoes with good support.  Drink plenty of  water while you exercise to prevent dehydration or heat stroke.  Work out until your breathing and your heartbeat get faster. Where to find more information  [place] Department of Health and Human Services: www.hhs.gov  Centers for Disease Control and Prevention (CDC): www.cdc.gov Summary  Exercising regularly is important. It will improve your overall fitness, flexibility, and endurance.  Regular exercise also will improve your overall health. It can help you control your weight, reduce stress, and improve your bone density.  Do not exercise so much that you hurt yourself, feel dizzy, or get very short of breath.  Before you start a new exercise program, talk with your health care provider. This information is not intended to replace advice given to you by your health care provider. Make sure you discuss any questions you have with your health care provider. Document Revised: 08/30/2017 Document Reviewed: 08/08/2017 Elsevier Patient Education  2020 [employer]   Budget-Friendly Healthy Eating There are many ways to save money at the grocery store and continue to eat healthy. You can be successful if you:  Plan meals according to your budget.  Make a grocery list and only purchase food according to your grocery list.  Prepare food yourself. What are tips for following this plan?  Reading food labels  Compare food labels between brand name foods and the store brand. Often the nutritional value is the same, but the store brand is lower cost.  Look for products that do not have added sugar, fat, or salt (sodium). These often cost the same but are healthier for you. Products may be labeled as: ? Sugar-free. ? Nonfat. ? Low-fat. ? Sodium-free. ? Low-sodium.  Look for lean ground beef labeled as at least 92% lean and 8% fat. Shopping  Buy only the items on your grocery list and go only to the areas of the store that have the items on your list.  Use coupons only for foods  and brands you normally buy. Avoid buying items you wouldn't normally buy simply because they are on sale.  Check online and in newspapers for weekly deals.  Buy healthy items from the bulk bins when available, such as herbs, spices, flour, pasta, nuts, and dried fruit.  Buy fruits and vegetables that are in season. Prices are usually lower on in-season produce.  Look at the unit price on the price tag. Use it to compare different brands and sizes to find out which item is the best deal.  Choose healthy items that are often low-cost, such as carrots, potatoes, apples, bananas, and oranges. Dried or canned beans are a low-cost protein source.  Buy in bulk and freeze extra food. Items you can buy in bulk include meats, fish, poultry, frozen fruits, and frozen vegetables.  Avoid buying "ready-to-eat" foods, such as pre-cut fruits and vegetables and pre-made salads.  If possible, shop around to discover where you can find the best prices. Consider other retailers such as dollar stores, larger wholesale stores, local fruit and vegetable stands, and farmers markets.  Do not shop when you are hungry. If you shop while hungry, it may be hard to stick to your list and budget.  Resist impulse buying. Use your grocery list as   your official plan for the week.  Buy a variety of vegetables and fruits by purchasing fresh, frozen, and canned items.  Look at the top and bottom shelves for deals. Foods at eye level (eye level of an adult or child) are usually more expensive.  Be efficient with your time when shopping. The more time you spend at the store, the more money you are likely to spend.  To save money when choosing more expensive foods like meats and dairy: ? Choose cheaper cuts of meat, such as bone-in chicken thighs and drumsticks instead of skinless and boneless chicken. When you are ready to prepare the chicken, you can remove the skin yourself to make it healthier. ? Choose lean meats like  chicken or turkey instead of beef. ? Choose canned seafood, such as tuna, salmon, or sardines. ? Buy eggs as a low-cost source of protein. ? Buy dried beans and peas, such as lentils, split peas, or kidney beans instead of meats. Dried beans and peas are a good alternative source of protein. ? Buy the larger tubs of yogurt instead of individual-sized containers.  Choose water instead of sodas and other sweetened beverages.  Avoid buying chips, cookies, and other "junk food." These items are usually expensive and not healthy. Cooking  Make extra food and freeze the extras in meal-sized containers or in individual portions for fast meals and snacks.  Pre-cook on days when you have extra time to prepare meals in advance. You can keep these meals in the fridge or freezer and reheat for a quick meal.  When you come home from the grocery store, wash, peel, and cut fruits and vegetables so they are ready to use and eat. This will help reduce food waste. Meal planning  Do not eat out or get fast food. Prepare food at home.  Make a grocery list and make sure to bring it with you to the store. If you have a smart phone, you could use your phone to create your shopping list.  Plan meals and snacks according to a grocery list and budget you create.  Use leftovers in your meal plan for the week.  Look for recipes where you can cook once and make enough food for two meals.  Include budget-friendly meals like stews, casseroles, and stir-fry dishes.  Try some meatless meals or try "no cook" meals like salads.  Make sure that half your plate is filled with fruits or vegetables. Choose from fresh, frozen, or canned fruits and vegetables. If eating canned, remember to rinse them before eating. This will remove any excess salt added for packaging. Summary  Eating healthy on a budget is possible if you plan your meals according to your budget, purchase according to your budget and grocery list, and  prepare food yourself.  Tips for buying more food on a limited budget include buying generic brands, using coupons only for foods you normally buy, and buying healthy items from the bulk bins when available.  Tips for buying cheaper food to replace expensive food include choosing cheaper, lean cuts of meat, and buying dried beans and peas. This information is not intended to replace advice given to you by your health care provider. Make sure you discuss any questions you have with your health care provider. Document Revised: 09/18/2017 Document Reviewed: 09/18/2017 Elsevier Patient Education  2020 [employer].   Bone Health Bones protect organs, store calcium, anchor muscles, and support the whole body. Keeping your bones strong is important, especially as you   get older. You can take actions to help keep your bones strong and healthy. Why is keeping my bones healthy important?  Keeping your bones healthy is important because your body constantly replaces bone cells. Cells get old, and new cells take their place. As we age, we lose bone cells because the body may not be able to make enough new cells to replace the old cells. The amount of bone cells and bone tissue you have is referred to as bone mass. The higher your bone mass, the stronger your bones. The aging process leads to an overall loss of bone mass in the body, which can increase the likelihood of:  Joint pain and stiffness.  Broken bones.  A condition in which the bones become weak and brittle (osteoporosis). A large decline in bone mass occurs in older adults. In women, it occurs about the time of menopause. What actions can I take to keep my bones healthy? Good health habits are important for maintaining healthy bones. This includes eating nutritious foods and exercising regularly. To have healthy bones, you need to get enough of the right minerals and vitamins. Most nutrition experts recommend getting these nutrients from the  foods that you eat. In some cases, taking supplements may also be recommended. Doing certain types of exercise is also important for bone health. What are the nutritional recommendations for healthy bones?  Eating a well-balanced diet with plenty of calcium and vitamin D will help to protect your bones. Nutritional recommendations vary from person to person. Ask your health care provider what is healthy for you. Here are some general guidelines. Get enough calcium Calcium is the most important (essential) mineral for bone health. Most people can get enough calcium from their diet, but supplements may be recommended for people who are at risk for osteoporosis. Good sources of calcium include:  Dairy products, such as low-fat or nonfat milk, cheese, and yogurt.  Dark green leafy vegetables, such as bok choy and broccoli.  Calcium-fortified foods, such as orange juice, cereal, bread, soy beverages, and tofu products.  Nuts, such as almonds. Follow these recommended amounts for daily calcium intake:  Children, age 1-3: 700 mg.  Children, age 4-8: 1,000 mg.  Children, age 9-13: 1,300 mg.  Teens, age 14-18: 1,300 mg.  Adults, age 19-50: 1,000 mg.  Adults, age 51-70: ? Men: 1,000 mg. ? Women: 1,200 mg.  Adults, age 71 or older: 1,200 mg.  Pregnant and breastfeeding females: ? Teens: 1,300 mg. ? Adults: 1,000 mg. Get enough vitamin D Vitamin D is the most essential vitamin for bone health. It helps the body absorb calcium. Sunlight stimulates the skin to make vitamin D, so be sure to get enough sunlight. If you live in a cold climate or you do not get outside often, your health care provider may recommend that you take vitamin D supplements. Good sources of vitamin D in your diet include:  Egg yolks.  Saltwater fish.  Milk and cereal fortified with vitamin D. Follow these recommended amounts for daily vitamin D intake:  Children and teens, age 1-18: 600 international  units.  Adults, age 50 or younger: 400-800 international units.  Adults, age 51 or older: 800-1,000 international units. Get other important nutrients Other nutrients that are important for bone health include:  Phosphorus. This mineral is found in meat, poultry, dairy foods, nuts, and legumes. The recommended daily intake for adult men and adult women is 700 mg.  Magnesium. This mineral is found in seeds, nuts, dark   green vegetables, and legumes. The recommended daily intake for adult men is 400-420 mg. For adult women, it is 310-320 mg.  Vitamin K. This vitamin is found in green leafy vegetables. The recommended daily intake is 120 mg for adult men and 90 mg for adult women. What type of physical activity is best for building and maintaining healthy bones? Weight-bearing and strength-building activities are important for building and maintaining healthy bones. Weight-bearing activities cause muscles and bones to work against gravity. Strength-building activities increase the strength of the muscles that support bones. Weight-bearing and muscle-building activities include:  Walking and hiking.  Jogging and running.  Dancing.  Gym exercises.  Lifting weights.  Tennis and racquetball.  Climbing stairs.  Aerobics. Adults should get at least 30 minutes of moderate physical activity on most days. Children should get at least 60 minutes of moderate physical activity on most days. Ask your health care provider what type of exercise is best for you. How can I find out if my bone mass is low? Bone mass can be measured with an X-ray test called a bone mineral density (BMD) test. This test is recommended for all women who are age 65 or older. It may also be recommended for:  Men who are age 70 or older.  People who are at risk for osteoporosis because of: ? Having bones that break easily. ? Having a long-term disease that weakens bones, such as kidney disease or rheumatoid  arthritis. ? Having menopause earlier than normal. ? Taking medicine that weakens bones, such as steroids, thyroid hormones, or hormone treatment for breast cancer or prostate cancer. ? Smoking. ? Drinking three or more alcoholic drinks a day. If you find that you have a low bone mass, you may be able to prevent osteoporosis or further bone loss by changing your diet and lifestyle. Where can I find more information? For more information, check out the following websites:  National Osteoporosis Foundation: www.nof.org/patients  National Institutes of Health: www.bones.nih.gov  International Osteoporosis Foundation: www.iofbonehealth.org Summary  The aging process leads to an overall loss of bone mass in the body, which can increase the likelihood of broken bones and osteoporosis.  Eating a well-balanced diet with plenty of calcium and vitamin D will help to protect your bones.  Weight-bearing and strength-building activities are also important for building and maintaining strong bones.  Bone mass can be measured with an X-ray test called a bone mineral density (BMD) test. This information is not intended to replace advice given to you by your health care provider. Make sure you discuss any questions you have with your health care provider. Document Revised: 10/14/2017 Document Reviewed: 10/14/2017 Elsevier Patient Education  2020 [employer].   

## 2020-06-24 NOTE — Progress Notes (Signed)
Gynecology Annual Exam  PCP: Adin Hector, MD  Chief Complaint:  Chief Complaint  Patient presents with  . Gynecologic Exam    no concerns    History of Present Illness: Patient is a 49 y.o. G0P0000 presents for annual exam. The patient has no complaints today.   LMP: No LMP recorded. (Menstrual status: Perimenopausal). Average Interval: regular, 28 days Duration of flow: 5 days Heavy Menses: no Clots: no Intermenstrual Bleeding: no Postcoital Bleeding: no Dysmenorrhea: no   The patient is sexually active. She currently uses OCP (estrogen/progesterone) for contraception. She denies dyspareunia.  The patient does perform self breast exams.  There is no notable family history of breast or ovarian cancer in her family.  The patient has regular exercise: yes, walking 5 miles a day    The patient denies current symptoms of depression.    GAD-7: 0 PHQ-9: 0  Review of Systems: Review of Systems  Constitutional: Negative for chills, fever, malaise/fatigue and weight loss.  HENT: Negative for congestion, hearing loss and sinus pain.   Eyes: Negative for blurred vision and double vision.  Respiratory: Negative for cough, sputum production, shortness of breath and wheezing.   Cardiovascular: Negative for chest pain, palpitations, orthopnea and leg swelling.  Gastrointestinal: Negative for abdominal pain, constipation, diarrhea, nausea and vomiting.  Genitourinary: Negative for dysuria, flank pain, frequency, hematuria and urgency.  Musculoskeletal: Negative for back pain, falls and joint pain.  Skin: Negative for itching and rash.  Neurological: Negative for dizziness and headaches.  Psychiatric/Behavioral: Negative for depression, substance abuse and suicidal ideas. The patient is not nervous/anxious.     Past Medical History:  Patient Active Problem List   Diagnosis Date Noted  . Genital herpes 05/29/2019  . 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 Hyperlipidemia   .  Edema   . Obesity   . Dyspepsia     Past Surgical History:  Past Surgical History:  Procedure Laterality Date  . BREAST BIOPSY Right 2005   Core Bx -fibroadenoma/ Dr Bary Castilla  . FNA of breast Right 11/2007   benign    Gynecologic History:  No LMP recorded. (Menstrual status: Perimenopausal). Contraception: OCP (estrogen/progesterone) Last Pap: Results were: 2020 NIL  Last mammogram: 2020 Results were: BI-RAD I  Obstetric History: G0P0000  Family History:  Family History  Problem Relation Age of Onset  . Hypertension Mother   . Prostate cancer Maternal Uncle        about 38, tested  . Lung cancer Maternal Uncle   . Dementia Father   . Breast cancer Neg Hx   . Ovarian cancer Neg Hx   . Diabetes Neg Hx     Social History:  Social History   Socioeconomic History  . Marital status: Married    Spouse name: Not on file  . Number of children: 0  . Years of education: 66  . Highest education level: Not on file  Occupational History  . Occupation: Patient Coordinator/ [employer]  Tobacco Use  . Smoking status: Never Smoker  . Smokeless tobacco: Never Used  Vaping Use  . Vaping Use: Never used  Substance and Sexual Activity  . Alcohol use: No  . Drug use: No  . Sexual activity: Yes    Partners: Male    Birth control/protection: Pill  Other Topics Concern  . Not on file  Social History Narrative  . Not on file   Social Determinants of Health   Financial Resource Strain:   . Difficulty of Paying  Living Expenses: Not on file  Food Insecurity:   . Worried About Charity fundraiser in the Last Year: Not on file  . Ran Out of Food in the Last Year: Not on file  Transportation Needs:   . Lack of Transportation (Medical): Not on file  . Lack of Transportation (Non-Medical): Not on file  Physical Activity:   . Days of Exercise per Week: Not on file  . Minutes of Exercise per Session: Not on file  Stress:   . Feeling of Stress : Not on file  Social Connections:     . Frequency of Communication with Friends and Family: Not on file  . Frequency of Social Gatherings with Friends and Family: Not on file  . Attends Religious Services: Not on file  . Active Member of Clubs or Organizations: Not on file  . Attends Archivist Meetings: Not on file  . Marital Status: Not on file  Intimate Partner Violence:   . Fear of Current or Ex-Partner: Not on file  . Emotionally Abused: Not on file  . Physically Abused: Not on file  . Sexually Abused: Not on file    Allergies:  No Known Allergies  Medications: Prior to Admission medications   Medication Sig Start Date End Date Taking? Authorizing Provider  azelastine (ASTELIN) 0.1 % nasal spray 2 sprays into each nostril Two (2) times a day. Use in each nostril as directed 08/11/19 03/28/21 Yes [provider]  benzonatate (TESSALON) 200 MG capsule Take by mouth. 02/18/20  Yes [provider]  Boric Acid CRYS Place 600 mgm in size 0 capsules vaginally qhs x 7days prn yeast infection symptoms 05/29/19  Yes Dalia Heading, CNM  calcium-vitamin D (OSCAL WITH D) 500-200 MG-UNIT tablet Take 1 tablet by mouth daily with breakfast.   Yes [provider]  clotrimazole-betamethasone (LOTRISONE) cream Apply 1 application topically 2 (two) times daily. Prn sx up to 2 wks 7/35/32  Yes Copland, Alicia B, PA-C  Cyanocobalamin (VITAMIN B 12 PO) Take 500 mcg by mouth daily.    Yes [provider]  fluticasone (FLONASE) 50 MCG/ACT nasal spray 2 sprays into each nostril two (2) times a day. 08/11/19  Yes [provider]  hydrochlorothiazide (HYDRODIURIL) 12.5 MG tablet  04/09/17  Yes [provider]  hydrocortisone 2.5 % cream Apply topically. 10/31/18  Yes [provider]  JUNEL FE 1/20 1-20 MG-MCG tablet TAKE 1 TABLET BY MOUTH EVERY DAY 04/08/20  Yes Dalia Heading, CNM  loratadine (CLARITIN) 10 MG tablet Take 10 mg by mouth daily.   Yes [provider]  loteprednol (ALREX) 0.2 % SUSP SHAKE LIQUID AND INSTILL 1 DROP IN BOTH EYES THREE TIMES DAILY 03/22/20  Yes [provider]  Multiple Vitamin (MULTI-VITAMINS) TABS Take by mouth.   Yes [provider]  nystatin (NYSTATIN) powder Apply topically 2 (two) times daily as needed. 05/29/19  Yes Dalia Heading, CNM  nystatin-triamcinolone ointment [facility]) Apply 1 application topically 2 (two) times daily as needed. To vulva 05/09/18  Yes Dalia Heading, CNM  omeprazole (PRILOSEC) 20 MG capsule TAKE ONE CAPSULE BY MOUTH TWICE A DAY 07/31/17  Yes [provider]  potassium chloride SA (K-DUR,KLOR-CON) 20 MEQ tablet Take 10 mEq by mouth daily.    Yes [provider]  Sulfacetamide Sodium, Acne, 10 % LOTN Apply 1 application topically 2 (two) times daily. 04/13/19  Yes [provider]  valACYclovir (VALTREX) 500 MG tablet Take 1 tablet (500 mg total) by  mouth daily. 06/03/20  Yes Alexanderia Gorby, Stefanie Libel, MD    Physical Exam Vitals: Blood pressure 108/70, height 5\' 8"  (1.727 m), weight 214 lb (97.1 kg).  General: NAD HEENT: normocephalic, anicteric Thyroid: no enlargement, no palpable nodules Pulmonary: No increased work of breathing, CTAB Cardiovascular: RRR, distal pulses 2+ Breast: Breast symmetrical, no tenderness, no palpable nodules or masses, no skin or nipple retraction present, no nipple discharge.  No axillary or supraclavicular lymphadenopathy. Abdomen: NABS, soft, non-tender, non-distended.  Umbilicus without lesions.  No hepatomegaly, splenomegaly or masses palpable. No evidence of hernia  Genitourinary:  External: Normal external female genitalia.  Normal urethral meatus, normal Bartholin's and Skene's glands.    Vagina: Normal vaginal mucosa, no evidence of prolapse.    Cervix: Grossly normal in appearance, no bleeding  Uterus: Non-enlarged, mobile, normal contour.  No CMT  Adnexa: Right adnexal enlargement  Rectal: deferred  Lymphatic:  no evidence of inguinal lymphadenopathy Extremities: no edema, erythema, or tenderness Neurologic: Grossly intact Psychiatric: mood appropriate, affect full  Female chaperone present for pelvic and breast  portions of the physical exam    Assessment: 49 y.o. G0P0000 routine annual exam  Plan: Problem List Items Addressed This Visit    None    Visit Diagnoses    Health maintenance examination    -  Primary   Encounter for annual routine gynecological examination       Breast cancer screening by mammogram       Relevant Orders   MM 3D SCREEN BREAST BILATERAL   Cervical cancer screening       Screening for diabetes mellitus       Colon cancer screening       Screen for STD (sexually transmitted disease)       Screening cholesterol level       Encounter for gynecological examination without abnormal finding       Screening for thyroid disorder          1) Mammogram - recommend yearly screening mammogram.  Mammogram Was ordered today   2) STI screening  was  offered and declined  3) ASCCP guidelines and rational discussed.  Patient opts for every 3 years screening interval  4) Contraception - the patient is currently using  OCP (estrogen/progesterone).  She is happy with her current form of contraception and plans to continue  5) Colonoscopy -- Screening recommended starting at age 7 for average risk individuals, age 81 for individuals deemed at increased risk (including African Americans) and recommended to continue until age 22.  For patient age 68-85 individualized approach is recommended.  Gold standard screening is via colonoscopy, Cologuard screening is an acceptable alternative for patient unwilling or unable to undergo colonoscopy.  "Colorectal cancer screening for average?risk adults: 2018 guideline update from the American Cancer Society"CA: A Cancer Journal for Clinicians: Feb 27, 2017   6) Routine healthcare maintenance including cholesterol, diabetes screening  discussed managed by PCP  7) Right adnexal/ovarian mass, patient will follow up for pelvic US  8) Return in about 1 year (around 06/24/2021) for annual.   [place], [facility] 06/24/2020, 9:45 AM

## 2020-07-08 ENCOUNTER — Ambulatory Visit: Payer: BC Managed Care – PPO | Admitting: Obstetrics and Gynecology

## 2020-07-08 ENCOUNTER — Ambulatory Visit: Payer: BC Managed Care – PPO

## 2020-08-05 ENCOUNTER — Telehealth (INDEPENDENT_AMBULATORY_CARE_PROVIDER_SITE_OTHER): Payer: Self-pay | Admitting: Gastroenterology

## 2020-08-05 ENCOUNTER — Other Ambulatory Visit: Payer: Self-pay

## 2020-08-05 DIAGNOSIS — Z1211 Encounter for screening for malignant neoplasm of colon: Secondary | ICD-10-CM

## 2020-08-05 MED ORDER — PEG 3350-KCL-NA BICARB-NACL 420 G PO SOLR: 4000.0000 mL | Freq: Once | ORAL | 0 refills | Status: AC

## 2020-08-05 NOTE — Progress Notes (Signed)
Gastroenterology Pre-Procedure Review  Request Date: Friday 09/09/20 Requesting Physician: Dr. Vicente Males  PATIENT REVIEW QUESTIONS: The patient responded to the following health history questions as indicated:    1. Are you having any GI issues? no 2. Do you have a personal history of Polyps? no 3. Do you have a family history of Colon Cancer or Polyps? no 4. Diabetes Mellitus? no 5. Joint replacements in the past 12 months?no 6. Major health problems in the past 3 months?no 7. Any artificial heart valves, MVP, or defibrillator?no    MEDICATIONS & ALLERGIES:    Patient reports the following regarding taking any anticoagulation/antiplatelet therapy:   Plavix, Coumadin, Eliquis, Xarelto, Lovenox, Pradaxa, Brilinta, or Effient? no Aspirin? no  Patient confirms/reports the following medications:  Current Outpatient Medications  Medication Sig Dispense Refill  . azelastine (ASTELIN) 0.1 % nasal spray 2 sprays into each nostril Two (2) times a day. Use in each nostril as directed    . benzonatate (TESSALON) 200 MG capsule Take by mouth.    . Boric Acid CRYS Place 600 mgm in size 0 capsules vaginally qhs x 7days prn yeast infection symptoms 500 g 6  . calcium-vitamin D (OSCAL WITH D) 500-200 MG-UNIT tablet Take 1 tablet by mouth daily with breakfast.    . clotrimazole-betamethasone (LOTRISONE) cream Apply 1 application topically 2 (two) times daily. Prn sx up to 2 wks 45 g 0  . Cyanocobalamin (VITAMIN B 12 PO) Take 500 mcg by mouth daily.     . fluticasone (FLONASE) 50 MCG/ACT nasal spray 2 sprays into each nostril two (2) times a day.    . hydrochlorothiazide (HYDRODIURIL) 12.5 MG tablet     . hydrocortisone 2.5 % cream Apply topically.    Marland Kitchen loratadine (CLARITIN) 10 MG tablet Take 10 mg by mouth daily.    Marland Kitchen loteprednol (ALREX) 0.2 % SUSP SHAKE LIQUID AND INSTILL 1 DROP IN BOTH EYES THREE TIMES DAILY    . Multiple Vitamin (MULTI-VITAMINS) TABS Take by mouth.    . norethindrone-ethinyl  estradiol (JUNEL FE 1/20) 1-20 MG-MCG tablet Take 1 tablet by mouth daily. 84 tablet 3  . nystatin (NYSTATIN) powder Apply topically 2 (two) times daily as needed. 30 g 0  . nystatin-triamcinolone ointment (MYCOLOG) Apply 1 application topically 2 (two) times daily as needed. To vulva 30 g 0  . omeprazole (PRILOSEC) 20 MG capsule TAKE ONE CAPSULE BY MOUTH TWICE A DAY    . potassium chloride SA (K-DUR,KLOR-CON) 20 MEQ tablet Take 10 mEq by mouth daily.     . Sulfacetamide Sodium, Acne, 10 % LOTN Apply 1 application topically 2 (two) times daily.    . valACYclovir (VALTREX) 500 MG tablet Take 1 tablet (500 mg total) by mouth daily. 90 tablet 3  . polyethylene glycol-electrolytes (NULYTELY) 420 g solution Take 4,000 mLs by mouth once for 1 dose. 4000 mL 0   No current facility-administered medications for this visit.    Patient confirms/reports the following allergies:  No Known Allergies  No orders of the defined types were placed in this encounter.   AUTHORIZATION INFORMATION Primary Insurance: 1D#: Group #:  Secondary Insurance: 1D#: Group #:  SCHEDULE INFORMATION: Date: 09/09/20 Time: Location:[facility]

## 2020-08-22 ENCOUNTER — Other Ambulatory Visit: Payer: Self-pay

## 2020-08-22 DIAGNOSIS — Z3041 Encounter for surveillance of contraceptive pills: Secondary | ICD-10-CM

## 2020-08-22 MED ORDER — NORETHIN ACE-ETH ESTRAD-FE 1-20 MG-MCG PO TABS: 1.0000 | ORAL_TABLET | Freq: Every day | ORAL | 3 refills | Status: DC

## 2020-08-29 ENCOUNTER — Ambulatory Visit: Payer: BC Managed Care – PPO

## 2020-08-29 ENCOUNTER — Ambulatory Visit: Payer: BC Managed Care – PPO | Admitting: Obstetrics and Gynecology

## 2020-08-30 ENCOUNTER — Telehealth: Payer: Self-pay

## 2020-08-30 NOTE — Telephone Encounter (Signed)
Returned patients call. Patient had questions regarding her nose piercing. I called to double check with the Endo unit nurse to confirm piercing has to be removed. Informed patient she could place a plastic holder there until surgery is completed. Pt verbalized she would like to cancel procedure at this time. She will callback to reschedule.

## 2020-09-09 ENCOUNTER — Ambulatory Visit: Admit: 2020-09-09 | Payer: BC Managed Care – PPO | Admitting: Gastroenterology

## 2020-09-09 SURGERY — COLONOSCOPY WITH PROPOFOL
Anesthesia: General

## 2020-10-17 ENCOUNTER — Ambulatory Visit: Payer: BC Managed Care – PPO | Admitting: Obstetrics and Gynecology

## 2020-10-17 ENCOUNTER — Ambulatory Visit: Payer: BC Managed Care – PPO

## 2020-11-14 ENCOUNTER — Other Ambulatory Visit: Payer: Self-pay

## 2020-11-14 ENCOUNTER — Ambulatory Visit
Admission: RE | Admit: 2020-11-14 | Discharge: 2020-11-14 | Disposition: A | Discharge status: Home / Self Care | Payer: BC Managed Care – PPO | Source: Ambulatory Visit | Attending: Obstetrics and Gynecology | Admitting: Obstetrics and Gynecology

## 2020-11-14 DIAGNOSIS — Z1231 Encounter for screening mammogram for malignant neoplasm of breast: Secondary | ICD-10-CM | POA: Diagnosis present

## 2020-11-18 ENCOUNTER — Ambulatory Visit: Payer: Self-pay | Admitting: Obstetrics and Gynecology

## 2020-11-18 ENCOUNTER — Ambulatory Visit: Payer: Self-pay

## 2020-12-08 ENCOUNTER — Other Ambulatory Visit: Payer: Self-pay | Admitting: Obstetrics and Gynecology

## 2020-12-08 DIAGNOSIS — N838 Other noninflammatory disorders of ovary, fallopian tube and broad ligament: Secondary | ICD-10-CM

## 2020-12-16 ENCOUNTER — Encounter: Payer: Self-pay | Admitting: Obstetrics and Gynecology

## 2020-12-16 ENCOUNTER — Ambulatory Visit (INDEPENDENT_AMBULATORY_CARE_PROVIDER_SITE_OTHER): Payer: BC Managed Care – PPO | Admitting: Obstetrics and Gynecology

## 2020-12-16 ENCOUNTER — Other Ambulatory Visit: Payer: Self-pay

## 2020-12-16 ENCOUNTER — Ambulatory Visit: Payer: Self-pay

## 2020-12-16 VITALS — BP 100/60 | Ht 68.0 in | Wt 220.0 lb

## 2020-12-16 DIAGNOSIS — N838 Other noninflammatory disorders of ovary, fallopian tube and broad ligament: Secondary | ICD-10-CM

## 2020-12-16 NOTE — Progress Notes (Signed)
Visit cancelled, has not had [place] yet- follow up on phone.

## 2020-12-29 ENCOUNTER — Other Ambulatory Visit: Payer: Self-pay

## 2020-12-29 ENCOUNTER — Ambulatory Visit
Admission: RE | Admit: 2020-12-29 | Discharge: 2020-12-29 | Disposition: A | Discharge status: Home / Self Care | Payer: BC Managed Care – PPO | Source: Ambulatory Visit | Attending: Obstetrics and Gynecology | Admitting: Obstetrics and Gynecology

## 2020-12-29 DIAGNOSIS — N838 Other noninflammatory disorders of ovary, fallopian tube and broad ligament: Secondary | ICD-10-CM | POA: Diagnosis present

## 2021-01-02 ENCOUNTER — Telehealth (INDEPENDENT_AMBULATORY_CARE_PROVIDER_SITE_OTHER): Payer: Self-pay | Admitting: Gastroenterology

## 2021-01-02 ENCOUNTER — Other Ambulatory Visit: Payer: Self-pay

## 2021-01-02 DIAGNOSIS — Z1211 Encounter for screening for malignant neoplasm of colon: Secondary | ICD-10-CM

## 2021-01-02 MED ORDER — PEG 3350-KCL-NA BICARB-NACL 420 G PO SOLR: 4000.0000 mL | Freq: Once | ORAL | 0 refills | Status: AC

## 2021-01-02 NOTE — Progress Notes (Signed)
Colonoscopy R/S from December 2021.  Triage completed 08/05/20.  Colonoscopy has been scheduled with Dr. Vicente Males on 05/05/21.  Pt advised of COVID test Wed 05/03/21.  Thanks,  [place], [place]

## 2021-03-21 ENCOUNTER — Telehealth: Payer: BC Managed Care – PPO

## 2021-03-21 NOTE — Telephone Encounter (Signed)
Pt calling for rx for yeast inf.  [phone] Pt states has a thick creamy d/c with itching and burning; has used monistates and it has not helped; states in the past boric acid and diflucan cleared it right up.

## 2021-03-22 NOTE — Telephone Encounter (Signed)
Pt calling; called yesterday; rx is not at pharm.  [phone]

## 2021-03-29 ENCOUNTER — Other Ambulatory Visit: Payer: Self-pay | Admitting: Obstetrics and Gynecology

## 2021-03-29 DIAGNOSIS — B3731 Acute candidiasis of vulva and vagina: Secondary | ICD-10-CM

## 2021-03-29 MED ORDER — FLUCONAZOLE 150 MG PO TABS: 150.0000 mg | ORAL_TABLET | ORAL | 0 refills | Status: AC

## 2021-03-29 NOTE — Telephone Encounter (Signed)
Rx sent 

## 2021-03-30 NOTE — Telephone Encounter (Signed)
Pt aware.

## 2021-05-05 ENCOUNTER — Ambulatory Visit: Payer: BC Managed Care – PPO | Admitting: Anesthesiology

## 2021-05-05 ENCOUNTER — Encounter
Admission: RE | Disposition: A | Discharge status: Home / Self Care | Payer: Self-pay | Source: Home / Self Care | Attending: Gastroenterology

## 2021-05-05 ENCOUNTER — Ambulatory Visit
Admission: RE | Admit: 2021-05-05 | Discharge: 2021-05-05 | Disposition: A | Discharge status: Home / Self Care | Payer: BC Managed Care – PPO | Attending: Gastroenterology | Admitting: Gastroenterology

## 2021-05-05 ENCOUNTER — Encounter: Payer: Self-pay | Admitting: Gastroenterology

## 2021-05-05 DIAGNOSIS — Z801 Family history of malignant neoplasm of trachea, bronchus and lung: Secondary | ICD-10-CM | POA: Insufficient documentation

## 2021-05-05 DIAGNOSIS — Z1211 Encounter for screening for malignant neoplasm of colon: Secondary | ICD-10-CM | POA: Diagnosis present

## 2021-05-05 DIAGNOSIS — K635 Polyp of colon: Secondary | ICD-10-CM

## 2021-05-05 DIAGNOSIS — Z79899 Other long term (current) drug therapy: Secondary | ICD-10-CM | POA: Insufficient documentation

## 2021-05-05 DIAGNOSIS — Z8249 Family history of ischemic heart disease and other diseases of the circulatory system: Secondary | ICD-10-CM | POA: Diagnosis not present

## 2021-05-05 DIAGNOSIS — D122 Benign neoplasm of ascending colon: Secondary | ICD-10-CM | POA: Diagnosis not present

## 2021-05-05 DIAGNOSIS — Z8042 Family history of malignant neoplasm of prostate: Secondary | ICD-10-CM | POA: Diagnosis not present

## 2021-05-05 DIAGNOSIS — Z820 Family history of epilepsy and other diseases of the nervous system: Secondary | ICD-10-CM | POA: Insufficient documentation

## 2021-05-05 HISTORY — DX: Gastro-esophageal reflux disease without esophagitis: K21.9

## 2021-05-05 HISTORY — PX: COLONOSCOPY WITH PROPOFOL: SHX5780

## 2021-05-05 SURGERY — COLONOSCOPY WITH PROPOFOL
Anesthesia: General

## 2021-05-05 MED ORDER — GLYCOPYRROLATE 0.2 MG/ML IJ SOLN
INTRAMUSCULAR | Status: AC
  Filled 2021-05-05: qty 1

## 2021-05-05 MED ORDER — SODIUM CHLORIDE 0.9 % IV SOLN: INTRAVENOUS | Status: DC

## 2021-05-05 MED ORDER — LIDOCAINE 2% (20 MG/ML) 5 ML SYRINGE
INTRAMUSCULAR | Status: DC | PRN
Start: 2021-05-05 — End: 2021-05-05
  Administered 2021-05-05: 25 mg via INTRAVENOUS

## 2021-05-05 MED ORDER — PROPOFOL 500 MG/50ML IV EMUL
INTRAVENOUS | Status: DC | PRN
  Administered 2021-05-05: 120 ug/kg/min via INTRAVENOUS

## 2021-05-05 MED ORDER — PROPOFOL 500 MG/50ML IV EMUL
INTRAVENOUS | Status: AC
  Filled 2021-05-05: qty 200

## 2021-05-05 MED ORDER — PROPOFOL 10 MG/ML IV BOLUS
INTRAVENOUS | Status: DC | PRN
Start: 2021-05-05 — End: 2021-05-05
  Administered 2021-05-05: 100 mg via INTRAVENOUS

## 2021-05-05 MED ORDER — LIDOCAINE HCL (PF) 2 % IJ SOLN
INTRAMUSCULAR | Status: AC
  Filled 2021-05-05: qty 5

## 2021-05-05 NOTE — Anesthesia Postprocedure Evaluation (Signed)
Anesthesia Post Note  Patient: Roberta Sanchez  Procedure(s) Performed: COLONOSCOPY WITH PROPOFOL  Patient location during evaluation: Endoscopy Anesthesia Type: General Level of consciousness: awake and alert Pain management: pain level controlled Vital Signs Assessment: post-procedure vital signs reviewed and stable Respiratory status: spontaneous breathing, nonlabored ventilation, respiratory function stable and patient connected to nasal cannula oxygen Cardiovascular status: blood pressure returned to baseline and stable Postop Assessment: no apparent nausea or vomiting Anesthetic complications: no   No notable events documented.   Last Vitals:  Vitals:   05/05/21 0813 05/05/21 0823  BP: 114/75 114/85  Pulse: 91 77  Resp: (!) 23 19  Temp:    SpO2: 100% 100%    Last Pain:  Vitals:   05/05/21 0823  TempSrc:   PainSc: 0-No pain                 Arita Miss

## 2021-05-05 NOTE — H&P (Signed)
Jonathon Bellows, MD [address] [place], [place], [place], [address] [address], [place], [place], [place], [address] Phone: [phone]  Fax: [phone]  Primary Care Physician:  Adin Hector, MD   Pre-Procedure History & Physical: HPI:  Roberta Sanchez is a 50 y.o. female is here for an colonoscopy.   Past Medical History:  Diagnosis Date   Cold sore    Dyspepsia    Edema    Genital herpes 11/2018   HSV 2 on culture   GERD (gastroesophageal reflux disease)    Hyperlipidemia    Obesity    Vulvovaginitis    recurrent monolial vv    Past Surgical History:  Procedure Laterality Date   BREAST BIOPSY Right 2005   Core Bx -fibroadenoma/ Dr Bary Castilla   FNA of breast Right 11/2007   benign   WISDOM TOOTH EXTRACTION      Prior to Admission medications   Medication Sig Start Date End Date Taking? Authorizing Provider  azelastine (ASTELIN) 0.1 % nasal spray 2 sprays into each nostril Two (2) times a day. Use in each nostril as directed 08/11/19 03/28/21  [provider]  benzonatate (TESSALON) 200 MG capsule Take by mouth. 02/18/20   [provider]  Boric Acid CRYS Place 600 mgm in size 0 capsules vaginally qhs x 7days prn yeast infection symptoms 05/29/19   Dalia Heading, CNM  calcium-vitamin D (OSCAL WITH D) 500-200 MG-UNIT tablet Take 1 tablet by mouth daily with breakfast.    [provider]  clotrimazole-betamethasone (LOTRISONE) cream Apply 1 application topically 2 (two) times daily. Prn sx up to 2 wks [id number]   Copland, Deirdre Evener, PA-C  Cyanocobalamin (VITAMIN B 12 PO) Take 500 mcg by mouth daily.     [provider]  fluticasone (FLONASE) 50 MCG/ACT nasal spray 2 sprays into each nostril two (2) times a day. 08/11/19   [provider]  hydrochlorothiazide (HYDRODIURIL) 12.5 MG tablet  04/09/17   [provider]  hydrocortisone 2.5 % cream Apply topically. 10/31/18   [provider]  loratadine  (CLARITIN) 10 MG tablet Take 10 mg by mouth daily.    [provider]  loteprednol (ALREX) 0.2 % SUSP SHAKE LIQUID AND INSTILL 1 DROP IN BOTH EYES THREE TIMES DAILY 03/22/20   [provider]  Multiple Vitamin (MULTI-VITAMINS) TABS Take by mouth.    [provider]  norethindrone-ethinyl estradiol (JUNEL FE 1/20) 1-20 MG-MCG tablet Take 1 tablet by mouth daily. 08/22/20   Schuman, Stefanie Libel, MD  nystatin (NYSTATIN) powder Apply topically 2 (two) times daily as needed. 05/29/19   Dalia Heading, CNM  nystatin-triamcinolone ointment [facility]) Apply 1 application topically 2 (two) times daily as needed. To vulva 05/09/18   Dalia Heading, CNM  omeprazole (PRILOSEC) 20 MG capsule TAKE ONE CAPSULE BY MOUTH TWICE A DAY 07/31/17   [provider]  potassium chloride SA (K-DUR,KLOR-CON) 20 MEQ tablet Take 10 mEq by mouth daily.     [provider]  Sulfacetamide Sodium, Acne, 10 % LOTN Apply 1 application topically 2 (two) times daily. 04/13/19   [provider]  valACYclovir (VALTREX) 500 MG tablet Take 1 tablet (500 mg total) by mouth daily. 06/03/20   Homero Fellers, MD    Allergies as of 01/02/2021   (No Known Allergies)    Family History  Problem Relation Age of Onset   Hypertension Mother    Prostate cancer Maternal Uncle  about 60, tested   Lung cancer Maternal Uncle    Dementia Father    Breast cancer Neg Hx    Ovarian cancer Neg Hx    Diabetes Neg Hx     Social History   Socioeconomic History   Marital status: Married    Spouse name: Not on file   Number of children: 0   Years of education: 14   Highest education level: Not on file  Occupational History   Occupation: Patient Coordinator/ [employer]  Tobacco Use   Smoking status: Never   Smokeless tobacco: Never  Vaping Use   Vaping Use: Never used  Substance and Sexual Activity   Alcohol use: No   Drug use: No   Sexual activity: Yes    Partners: Male     Birth control/protection: Pill  Other Topics Concern   Not on file  Social History Narrative   Not on file   Social Determinants of Health   Financial Resource Strain: Not on file  Food Insecurity: Not on file  Transportation Needs: Not on file  Physical Activity: Not on file  Stress: Not on file  Social Connections: Not on file  Intimate Partner Violence: Not on file    Review of Systems: See HPI, otherwise negative ROS  Physical Exam: BP 124/81   Pulse 94   Temp (!) 96.6 F (35.9 C) (Temporal)   Resp 20   Ht '5\' 7"'$  (1.702 m)   Wt 102.1 kg   SpO2 100%   BMI 35.24 kg/m  General:   Alert,  pleasant and cooperative in NAD Head:  Normocephalic and atraumatic. Neck:  Supple; no masses or thyromegaly. Lungs:  Clear throughout to auscultation, normal respiratory effort.    Heart:  +S1, +S2, Regular rate and rhythm, No edema. Abdomen:  Soft, nontender and nondistended. Normal bowel sounds, without guarding, and without rebound.   Neurologic:  Alert and  oriented x4;  grossly normal neurologically.  Impression/Plan: Roberta Sanchez is here for an colonoscopy to be performed for Screening colonoscopy average risk   Risks, benefits, limitations, and alternatives regarding  colonoscopy have been reviewed with the patient.  Questions have been answered.  All parties agreeable.   Jonathon Bellows, MD  05/05/2021, 7:42 AM

## 2021-05-05 NOTE — Anesthesia Preprocedure Evaluation (Signed)
Anesthesia Evaluation  Patient identified by MRN, date of birth, ID band Patient awake    Reviewed: Allergy & Precautions, NPO status , Patient's Chart, lab work & pertinent test results  History of Anesthesia Complications Negative for: history of anesthetic complications  Airway Mallampati: III  TM Distance: >3 FB Neck ROM: Full    Dental no notable dental hx. (+) Teeth Intact   Pulmonary neg pulmonary ROS, neg sleep apnea, neg COPD, Patient abstained from smoking.Not current smoker,    Pulmonary exam normal breath sounds clear to auscultation       Cardiovascular Exercise Tolerance: Good METS(-) hypertension(-) CAD and (-) Past MI negative cardio ROS  (-) dysrhythmias  Rhythm:Regular Rate:Normal - Systolic murmurs    Neuro/Psych negative neurological ROS  negative psych ROS   GI/Hepatic GERD  Medicated and Controlled,(+)     (-) substance abuse  ,   Endo/Other  neg diabetes  Renal/GU negative Renal ROS     Musculoskeletal   Abdominal (+) + obese,   Peds  Hematology   Anesthesia Other Findings Past Medical History: No date: Cold sore No date: Dyspepsia No date: Edema 11/2018: Genital herpes     Comment:  HSV 2 on culture No date: GERD (gastroesophageal reflux disease) No date: Hyperlipidemia No date: Obesity No date: Vulvovaginitis     Comment:  recurrent monolial vv  Reproductive/Obstetrics                             Anesthesia Physical Anesthesia Plan  ASA: 2  Anesthesia Plan: General   Post-op Pain Management:    Induction: Intravenous  PONV Risk Score and Plan: 3 and Ondansetron, Propofol infusion and TIVA  Airway Management Planned: Nasal Cannula  Additional Equipment: None  Intra-op Plan:   Post-operative Plan:   Informed Consent: I have reviewed the patients History and Physical, chart, labs and discussed the procedure including the risks, benefits and  alternatives for the proposed anesthesia with the patient or authorized representative who has indicated his/her understanding and acceptance.     Dental advisory given  Plan Discussed with: CRNA and Surgeon  Anesthesia Plan Comments: (Discussed risks of anesthesia with patient, including possibility of difficulty with spontaneous ventilation under anesthesia necessitating airway intervention, PONV, and rare risks such as cardiac or respiratory or neurological events, and allergic reactions. Patient understands.)        Anesthesia Quick Evaluation

## 2021-05-05 NOTE — Transfer of Care (Signed)
Immediate Anesthesia Transfer of Care Note  Patient: PRITHIKA PROCTOR  Procedure(s) Performed: COLONOSCOPY WITH PROPOFOL  Patient Location: Endoscopy Unit  Anesthesia Type:General  Level of Consciousness: drowsy  Airway & Oxygen Therapy: Patient Spontanous Breathing  Post-op Assessment: Report given to RN and Post -op Vital signs reviewed and stable  Post vital signs: Reviewed  Last Vitals:  Vitals Value Taken Time  BP    Temp    Pulse 91 05/05/21 0802  Resp 15 05/05/21 0802  SpO2 100 % 05/05/21 0802  Vitals shown include unvalidated device data.  Last Pain:  Vitals:   05/05/21 0709  TempSrc: Temporal  PainSc: 0-No pain         Complications: No notable events documented.

## 2021-05-05 NOTE — Op Note (Signed)
[facility] Patient Name: Roberta Sanchez Procedure Date: 05/05/2021 7:35 AM MRN: [id number] Account #: [id number] Date of Birth: 1971/06/14 Admit Type: Outpatient Age: 50 Room: [facility] ENDO ROOM 4 Gender: Female Note Status: Finalized Procedure:             Colonoscopy Indications:           Screening for colorectal malignant neoplasm Providers:             Jonathon Bellows MD, MD Referring MD:          Ramonita Lab, MD (Referring MD) Medicines:             Monitored Anesthesia Care Complications:         No immediate complications. Procedure:             Pre-Anesthesia Assessment:                        - Prior to the procedure, a History and Physical was                         performed, and patient medications, allergies and                         sensitivities were reviewed. The patient's tolerance                         of previous anesthesia was reviewed.                        - ASA Grade Assessment: II - A patient with mild                         systemic disease.                        After obtaining informed consent, the colonoscope was                         passed under direct vision. Throughout the procedure,                         the patient's blood pressure, pulse, and oxygen                         saturations were monitored continuously. The                         Colonoscope was introduced through the anus and                         advanced to the the cecum, identified by the                         appendiceal orifice. The colonoscopy was performed                         without difficulty. The patient tolerated the                         procedure well. The quality of the bowel  preparation                         was excellent. Findings:      The perianal and digital rectal examinations were normal.      A 3 mm polyp was found in the ascending colon. The polyp was sessile.       The polyp was removed with a jumbo cold  forceps. Resection and retrieval       were complete.      The exam was otherwise without abnormality on direct and retroflexion       views. Impression:            - One 3 mm polyp in the ascending colon, removed with                         a jumbo cold forceps. Resected and retrieved.                        - The examination was otherwise normal on direct and                         retroflexion views. Recommendation:        - Discharge patient to home (with escort).                        - Resume previous diet.                        - Continue present medications.                        - Await pathology results.                        - Repeat colonoscopy for surveillance based on                         pathology results. Procedure Code(s):     --- Professional ---                        [phone], Colonoscopy, flexible; with biopsy, single or                         multiple Diagnosis Code(s):     --- Professional ---                        Z12.11, Encounter for screening for malignant neoplasm                         of colon                        K63.5, Polyp of colon CPT copyright 2019 American Medical Association. All rights reserved. The codes documented in this report are preliminary and upon coder review may  be revised to meet current compliance requirements. Jonathon Bellows, MD Jonathon Bellows MD, MD 05/05/2021 8:00:49 AM This report has been signed electronically. Number of Addenda: 0 Note Initiated On: 05/05/2021 7:35 AM Scope Withdrawal Time: 0 hours 7 minutes 50 seconds  Total Procedure Duration: 0 hours  11 minutes 16 seconds  Estimated Blood Loss:  Estimated blood loss: none.      [facility]

## 2021-05-08 ENCOUNTER — Encounter: Payer: Self-pay | Admitting: Gastroenterology

## 2021-05-08 LAB — SURGICAL PATHOLOGY

## 2021-05-24 ENCOUNTER — Encounter: Payer: Self-pay | Admitting: Gastroenterology

## 2021-06-08 ENCOUNTER — Other Ambulatory Visit: Payer: Self-pay | Admitting: Obstetrics and Gynecology

## 2021-06-08 DIAGNOSIS — A6004 Herpesviral vulvovaginitis: Secondary | ICD-10-CM

## 2021-06-30 ENCOUNTER — Other Ambulatory Visit: Payer: Self-pay

## 2021-06-30 ENCOUNTER — Encounter: Payer: Self-pay | Admitting: Advanced Practice Midwife

## 2021-06-30 ENCOUNTER — Ambulatory Visit (INDEPENDENT_AMBULATORY_CARE_PROVIDER_SITE_OTHER): Payer: BC Managed Care – PPO | Admitting: Advanced Practice Midwife

## 2021-06-30 VITALS — BP 110/68 | HR 100 | Ht 67.0 in | Wt 226.0 lb

## 2021-06-30 DIAGNOSIS — Z0000 Encounter for general adult medical examination without abnormal findings: Secondary | ICD-10-CM

## 2021-06-30 DIAGNOSIS — B373 Candidiasis of vulva and vagina: Secondary | ICD-10-CM | POA: Diagnosis not present

## 2021-06-30 DIAGNOSIS — A6004 Herpesviral vulvovaginitis: Secondary | ICD-10-CM | POA: Diagnosis not present

## 2021-06-30 DIAGNOSIS — N898 Other specified noninflammatory disorders of vagina: Secondary | ICD-10-CM

## 2021-06-30 DIAGNOSIS — Z3041 Encounter for surveillance of contraceptive pills: Secondary | ICD-10-CM | POA: Diagnosis not present

## 2021-06-30 DIAGNOSIS — B3731 Acute candidiasis of vulva and vagina: Secondary | ICD-10-CM

## 2021-06-30 DIAGNOSIS — N760 Acute vaginitis: Secondary | ICD-10-CM

## 2021-06-30 DIAGNOSIS — Z1239 Encounter for other screening for malignant neoplasm of breast: Secondary | ICD-10-CM

## 2021-06-30 MED ORDER — BORIC ACID CRYS: CRYSTALS | 6 refills | Status: AC

## 2021-06-30 MED ORDER — NYSTATIN-TRIAMCINOLONE 100000-0.1 UNIT/GM-% EX OINT: 1.0000 "application " | TOPICAL_OINTMENT | Freq: Two times a day (BID) | CUTANEOUS | 0 refills | Status: AC | PRN

## 2021-06-30 MED ORDER — NYSTATIN 100000 UNIT/GM EX POWD: Freq: Two times a day (BID) | CUTANEOUS | 0 refills | Status: DC | PRN

## 2021-06-30 MED ORDER — CLOTRIMAZOLE-BETAMETHASONE 1-0.05 % EX CREA: 1.0000 "application " | TOPICAL_CREAM | Freq: Two times a day (BID) | CUTANEOUS | 0 refills | Status: AC

## 2021-06-30 MED ORDER — VALACYCLOVIR HCL 500 MG PO TABS: 500.0000 mg | ORAL_TABLET | Freq: Every day | ORAL | 3 refills | Status: DC

## 2021-06-30 MED ORDER — NORETHIN ACE-ETH ESTRAD-FE 1-20 MG-MCG PO TABS: 1.0000 | ORAL_TABLET | Freq: Every day | ORAL | 3 refills | Status: DC

## 2021-06-30 NOTE — Progress Notes (Addendum)
Gynecology Annual Exam  PCP: Adin Hector, MD  Chief Complaint:  Chief Complaint  Patient presents with   Gynecologic Exam    Annual - no concerns. RM 5    History of Present Illness:Patient is a 50 y.o. G0P0000 presents for annual exam. The patient has no gyn complaints today. She prefers to continue taking OCP for now to control cycles. We discussed trial of weaning in the future. She requests refills of her previously prescribed medications for treatment of recurrent vaginitis. She denies symptoms today.  LMP: No LMP recorded. (Menstrual status: Perimenopausal). She has a period approximately every 5 months that is light and lasts for 2 days. She denies post coital bleeding.  The patient is sexually active. She denies dyspareunia.  The patient does perform self breast exams.  There is no notable family history of breast or ovarian cancer in her family.  The patient wears seatbelts: yes.   The patient has regular exercise:  she admits "not much" currently, she admits eating a lot of fast food, she admits adequate hydration and sleep .    The patient denies current symptoms of depression.     Review of Systems: ROS  Past Medical History:  Patient Active Problem List   Diagnosis Date Noted   Pure hypercholesterolemia 12/15/2019   Genital herpes 05/29/2019   Recurrent vaginitis 01/06/2018   Hyperlipidemia    Edema    Obesity    Dyspepsia     Past Surgical History:  Past Surgical History:  Procedure Laterality Date   BREAST BIOPSY Right 2005   Core Bx -fibroadenoma/ Dr Bary Castilla   COLONOSCOPY WITH PROPOFOL N/A 05/05/2021   Procedure: COLONOSCOPY WITH PROPOFOL;  Surgeon: Jonathon Bellows, MD;  Location: [facility] ENDOSCOPY;  Service: Gastroenterology;  Laterality: N/A;   FNA of breast Right 11/2007   benign   WISDOM TOOTH EXTRACTION      Gynecologic History:  No LMP recorded. (Menstrual status: Perimenopausal). Last Pap: 2 years ago Results were:  no abnormalities  Last  mammogram: 7 months ago Results were: BI-RAD I  Obstetric History: G0P0000  Family History:  Family History  Problem Relation Age of Onset   Hypertension Mother    Prostate cancer Maternal Uncle        about 72, tested   Lung cancer Maternal Uncle    Dementia Father    Breast cancer Neg Hx    Ovarian cancer Neg Hx    Diabetes Neg Hx     Social History:  Social History   Socioeconomic History   Marital status: Married    Spouse name: Not on file   Number of children: 0   Years of education: 14   Highest education level: Not on file  Occupational History   Occupation: Patient Coordinator/ [employer]  Tobacco Use   Smoking status: Never   Smokeless tobacco: Never  Vaping Use   Vaping Use: Never used  Substance and Sexual Activity   Alcohol use: No   Drug use: No   Sexual activity: Yes    Partners: Male    Birth control/protection: Pill  Other Topics Concern   Not on file  Social History Narrative   Not on file   Social Determinants of Health   Financial Resource Strain: Not on file  Food Insecurity: Not on file  Transportation Needs: Not on file  Physical Activity: Not on file  Stress: Not on file  Social Connections: Not on file  Intimate Partner Violence: Not  on file    Allergies:  No Known Allergies  Medications: Prior to Admission medications   Medication Sig Start Date End Date Taking? Authorizing Provider  calcium-vitamin D (OSCAL WITH D) 500-200 MG-UNIT tablet Take 1 tablet by mouth daily with breakfast.   Yes [provider]  Cyanocobalamin (VITAMIN B 12 PO) Take 500 mcg by mouth daily.    Yes [provider]  fluticasone (FLONASE) 50 MCG/ACT nasal spray 2 sprays into each nostril two (2) times a day. 08/11/19  Yes [provider]  hydrochlorothiazide (HYDRODIURIL) 12.5 MG tablet  04/09/17  Yes [provider]  hydrocortisone 2.5 % cream Apply topically. 10/31/18  Yes [provider]  loratadine (CLARITIN)  10 MG tablet Take 10 mg by mouth daily.   Yes [provider]  omeprazole (PRILOSEC) 20 MG capsule TAKE ONE CAPSULE BY MOUTH TWICE A DAY 07/31/17  Yes [provider]  potassium chloride SA (K-DUR,KLOR-CON) 20 MEQ tablet Take 10 mEq by mouth daily.    Yes [provider]  azelastine (ASTELIN) 0.1 % nasal spray 2 sprays into each nostril Two (2) times a day. Use in each nostril as directed 08/11/19 03/28/21  [provider]  benzonatate (TESSALON) 200 MG capsule Take by mouth. 02/18/20   [provider]  Boric Acid CRYS Place 600 mgm in size 0 capsules vaginally qhs x 7days prn yeast infection symptoms 06/30/21   Rod Can, CNM  clotrimazole-betamethasone (LOTRISONE) cream Apply 1 application topically 2 (two) times daily. Prn sx up to 2 wks 06/30/21   Rod Can, CNM  loteprednol (ALREX) 0.2 % SUSP SHAKE LIQUID AND INSTILL 1 DROP IN BOTH EYES THREE TIMES DAILY 03/22/20   [provider]  Multiple Vitamin (MULTI-VITAMINS) TABS Take by mouth.    [provider]  norethindrone-ethinyl estradiol-FE (JUNEL FE 1/20) 1-20 MG-MCG tablet Take 1 tablet by mouth daily. 06/30/21   Rod Can, CNM  nystatin powder Apply topically 2 (two) times daily as needed. 06/30/21   Rod Can, CNM  nystatin-triamcinolone ointment (MYCOLOG) Apply 1 application topically 2 (two) times daily as needed. To vulva 06/30/21   Rod Can, CNM  Sulfacetamide Sodium, Acne, 10 % LOTN Apply 1 application topically 2 (two) times daily. 04/13/19   [provider]  valACYclovir (VALTREX) 500 MG tablet Take 1 tablet (500 mg total) by mouth daily. 06/30/21   Rod Can, CNM    Physical Exam Vitals: Blood pressure 110/68, pulse 100, height 5\' 7"  (1.702 m), weight 226 lb (102.5 kg).  General: NAD HEENT: normocephalic, anicteric Thyroid: no enlargement, no palpable nodules Pulmonary: No increased work of breathing, CTAB Cardiovascular: RRR, distal  pulses 2+ Breast: Breast symmetrical, no tenderness, no palpable nodules or masses, no skin or nipple retraction present, no nipple discharge.  No axillary or supraclavicular lymphadenopathy. Abdomen: NABS, soft, non-tender, non-distended.  Umbilicus without lesions.  No hepatomegaly, splenomegaly or masses palpable. No evidence of hernia  Genitourinary:  External: Normal external female genitalia.  Normal urethral meatus, normal Bartholin's and Skene's glands.    Vagina: Normal vaginal mucosa, no evidence of prolapse.    Cervix: Grossly normal in appearance, no bleeding  Uterus: Non-enlarged, mobile, normal contour.  No CMT  Adnexa: ovaries non-enlarged, no adnexal masses  Rectal: deferred  Lymphatic: no evidence of inguinal lymphadenopathy Extremities: no edema, erythema, or tenderness Neurologic: Grossly intact Psychiatric: mood appropriate, affect full  Female chaperone present for pelvic and breast  portions of the physical exam     Assessment: 50  y.o. G0P0000 routine annual exam  Plan: Problem List Items Addressed This Visit       Genitourinary   Genital herpes   Relevant Medications   clotrimazole-betamethasone (LOTRISONE) cream   nystatin powder   nystatin-triamcinolone ointment (MYCOLOG)   valACYclovir (VALTREX) 500 MG tablet   Other Visit Diagnoses     Well woman exam without gynecological exam    -  Primary   Relevant Orders   MM 3D SCREEN BREAST BILATERAL   Candidal vaginitis       Pos sx and exam/ neg wet prep. Rx lotrisone crm/diflucan. Add probtiotics. F/u prn. Rx RF boric acid for future sx.    Relevant Medications   Boric Acid CRYS   clotrimazole-betamethasone (LOTRISONE) cream   nystatin powder   nystatin-triamcinolone ointment (MYCOLOG)   valACYclovir (VALTREX) 500 MG tablet   Encounter for birth control pills maintenance       Relevant Medications   norethindrone-ethinyl estradiol-FE (JUNEL FE 1/20) 1-20 MG-MCG tablet   Vaginal itching        Relevant Medications   nystatin powder   nystatin-triamcinolone ointment (MYCOLOG)   Breast screening       Relevant Orders   MM 3D SCREEN BREAST BILATERAL       1) Mammogram - recommend yearly screening mammogram.  Mammogram Was ordered today to be scheduled on or after 11/14/21  2) STI screening  was offered and declined  3) ASCCP guidelines and rationale discussed.  Patient opts for every 3 years screening interval  4) Osteoporosis  - per USPTF routine screening DEXA at age 57  Consider FDA-approved medical therapies in postmenopausal women and men aged 2 years and older, based on the following: a) A hip or vertebral (clinical or morphometric) fracture b) T-score ? -2.5 at the femoral neck or spine after appropriate evaluation to exclude secondary causes C) Low bone mass (T-score between -1.0 and -2.5 at the femoral neck or spine) and a 10-year probability of a hip fracture ? 3% or a 10-year probability of a major osteoporosis-related fracture ? 20% based on the [place]-adapted WHO algorithm   5) Routine healthcare maintenance including cholesterol, diabetes screening discussed managed by PCP  6) Colonoscopy is up to date.  Screening recommended starting at age 50 for average risk individuals, age 34 for individuals deemed at increased risk (including African Americans) and recommended to continue until age 67.  For patient age 22-85 individualized approach is recommended.  Gold standard screening is via colonoscopy, Cologuard screening is an acceptable alternative for patient unwilling or unable to undergo colonoscopy.  "Colorectal cancer screening for average?risk adults: 2018 guideline update from the American Cancer Society"CA: A Cancer Journal for Clinicians: Feb 27, 2017   7) Increase healthy lifestyle; diet, exercise  7) Return in about 1 year (around 06/30/2022) for annual established gyn.    Christean Leaf, CNM [facility] 06/30/21, 1:12  PM

## 2021-06-30 NOTE — Patient Instructions (Signed)
Health Maintenance, Female Adopting a healthy lifestyle and getting preventive care are important in promoting health and wellness. Ask your health care provider about: The right schedule for you to have regular tests and exams. Things you can do on your own to prevent diseases and keep yourself healthy. What should I know about diet, weight, and exercise? Eat a healthy diet  Eat a diet that includes plenty of vegetables, fruits, low-fat dairy products, and lean protein. Do not eat a lot of foods that are high in solid fats, added sugars, or sodium. Maintain a healthy weight Body mass index (BMI) is used to identify weight problems. It estimates body fat based on height and weight. Your health care provider can help determine your BMI and help you achieve or maintain a healthy weight. Get regular exercise Get regular exercise. This is one of the most important things you can do for your health. Most adults should: Exercise for at least 150 minutes each week. The exercise should increase your heart rate and make you sweat (moderate-intensity exercise). Do strengthening exercises at least twice a week. This is in addition to the moderate-intensity exercise. Spend less time sitting. Even light physical activity can be beneficial. Watch cholesterol and blood lipids Have your blood tested for lipids and cholesterol at 50 years of age, then have this test every 5 years. Have your cholesterol levels checked more often if: Your lipid or cholesterol levels are high. You are older than 50 years of age. You are at high risk for heart disease. What should I know about cancer screening? Depending on your health history and family history, you may need to have cancer screening at various ages. This may include screening for: Breast cancer. Cervical cancer. Colorectal cancer. Skin cancer. Lung cancer. What should I know about heart disease, diabetes, and high blood pressure? Blood pressure and heart  disease High blood pressure causes heart disease and increases the risk of stroke. This is more likely to develop in people who have high blood pressure readings, are of African descent, or are overweight. Have your blood pressure checked: Every 3-5 years if you are 18-39 years of age. Every year if you are 40 years old or older. Diabetes Have regular diabetes screenings. This checks your fasting blood sugar level. Have the screening done: Once every three years after age 40 if you are at a normal weight and have a low risk for diabetes. More often and at a younger age if you are overweight or have a high risk for diabetes. What should I know about preventing infection? Hepatitis B If you have a higher risk for hepatitis B, you should be screened for this virus. Talk with your health care provider to find out if you are at risk for hepatitis B infection. Hepatitis C Testing is recommended for: Everyone born from 1945 through 1965. Anyone with known risk factors for hepatitis C. Sexually transmitted infections (STIs) Get screened for STIs, including gonorrhea and chlamydia, if: You are sexually active and are younger than 50 years of age. You are older than 50 years of age and your health care provider tells you that you are at risk for this type of infection. Your sexual activity has changed since you were last screened, and you are at increased risk for chlamydia or gonorrhea. Ask your health care provider if you are at risk. Ask your health care provider about whether you are at high risk for HIV. Your health care provider may recommend a prescription medicine   to help prevent HIV infection. If you choose to take medicine to prevent HIV, you should first get tested for HIV. You should then be tested every 3 months for as long as you are taking the medicine. Pregnancy If you are about to stop having your period (premenopausal) and you may become pregnant, seek counseling before you get  pregnant. Take 400 to 800 micrograms (mcg) of folic acid every day if you become pregnant. Ask for birth control (contraception) if you want to prevent pregnancy. Osteoporosis and menopause Osteoporosis is a disease in which the bones lose minerals and strength with aging. This can result in bone fractures. If you are 65 years old or older, or if you are at risk for osteoporosis and fractures, ask your health care provider if you should: Be screened for bone loss. Take a calcium or vitamin D supplement to lower your risk of fractures. Be given hormone replacement therapy (HRT) to treat symptoms of menopause. Follow these instructions at home: Lifestyle Do not use any products that contain nicotine or tobacco, such as cigarettes, e-cigarettes, and chewing tobacco. If you need help quitting, ask your health care provider. Do not use street drugs. Do not share needles. Ask your health care provider for help if you need support or information about quitting drugs. Alcohol use Do not drink alcohol if: Your health care provider tells you not to drink. You are pregnant, may be pregnant, or are planning to become pregnant. If you drink alcohol: Limit how much you use to 0-1 drink a day. Limit intake if you are breastfeeding. Be aware of how much alcohol is in your drink. In the [place] one drink equals one 12 oz bottle of beer (355 mL), one 5 oz glass of wine (148 mL), or one 1 oz glass of hard liquor (44 mL). General instructions Schedule regular health, dental, and eye exams. Stay current with your vaccines. Tell your health care provider if: You often feel depressed. You have ever been abused or do not feel safe at home. Summary Adopting a healthy lifestyle and getting preventive care are important in promoting health and wellness. Follow your health care provider's instructions about healthy diet, exercising, and getting tested or screened for diseases. Follow your health care provider's  instructions on monitoring your cholesterol and blood pressure. This information is not intended to replace advice given to you by your health care provider. Make sure you discuss any questions you have with your health care provider. Document Revised: 11/25/2020 Document Reviewed: 09/10/2018 Elsevier Patient Education  2022 Elsevier Inc.  

## 2021-07-12 ENCOUNTER — Telehealth: Payer: Self-pay

## 2021-07-12 NOTE — Telephone Encounter (Signed)
Pt calling; is having vulvar pain; started yesterday.  [phone]  Pt states it hurts to wipe; today her pelvic area hurts; it's mostly on the left side at the top of vagina; hurts to sit; had IC Sunday but wasn't that rough.

## 2021-07-12 NOTE — Telephone Encounter (Signed)
MyChart message sent to patient regarding symptoms with recommendations given

## 2021-08-10 ENCOUNTER — Other Ambulatory Visit: Payer: Self-pay | Admitting: Obstetrics and Gynecology

## 2021-08-10 DIAGNOSIS — Z3041 Encounter for surveillance of contraceptive pills: Secondary | ICD-10-CM

## 2021-11-17 ENCOUNTER — Other Ambulatory Visit: Payer: Self-pay

## 2021-11-17 ENCOUNTER — Ambulatory Visit
Admission: RE | Admit: 2021-11-17 | Discharge: 2021-11-17 | Disposition: A | Discharge status: Home / Self Care | Payer: BC Managed Care – PPO | Source: Ambulatory Visit | Attending: Advanced Practice Midwife | Admitting: Advanced Practice Midwife

## 2021-11-17 DIAGNOSIS — Z1231 Encounter for screening mammogram for malignant neoplasm of breast: Secondary | ICD-10-CM | POA: Diagnosis present

## 2021-11-17 DIAGNOSIS — Z0000 Encounter for general adult medical examination without abnormal findings: Secondary | ICD-10-CM

## 2021-11-17 DIAGNOSIS — Z1239 Encounter for other screening for malignant neoplasm of breast: Secondary | ICD-10-CM

## 2022-07-12 ENCOUNTER — Ambulatory Visit: Payer: BC Managed Care – PPO | Admitting: Obstetrics and Gynecology

## 2022-08-29 NOTE — Progress Notes (Unsigned)
PCP: Adin Hector, MD   No chief complaint on file.   HPI:      [name] is a 51 y.o. G0P0000 whose LMP was No LMP recorded. (Menstrual status: Perimenopausal)., presents today for her annual examination.  Her menses are {norm/abn:715}, lasting {number: 22536} days.  Dysmenorrhea {dysmen:716}. She {does:18564} have intermenstrual bleeding. Infrequent on OCPs She {does:18564} have vasomotor sx.   Sex activity: {sex active: 315163}. She {does:18564} have vaginal dryness.  Last Pap: 05/29/19 Results were: no abnormalities /neg HPV DNA.  Hx of STDs: {STD hx:14358}  Last mammogram: 11/17/21 Results were: normal--routine follow-up in 12 months There is no FH of breast cancer. There is no FH of ovarian cancer. The patient {does:18564} do self-breast exams.  Colonoscopy: 8/22 with [facility];  Repeat due after 10 years.   Tobacco use: {tob:20664} Alcohol use: {Alcohol:11675} No drug use Exercise: {exercise:31265}  She {does:18564} get adequate calcium and Vitamin D in her diet.  Labs with PCP.   Patient Active Problem List   Diagnosis Date Noted   Pure hypercholesterolemia 12/15/2019   Genital herpes 05/29/2019   Recurrent vaginitis 01/06/2018   Hyperlipidemia    Edema    Obesity    Dyspepsia     Past Surgical History:  Procedure Laterality Date   BREAST BIOPSY Right 2005   Core Bx -fibroadenoma/ Dr Bary Castilla   COLONOSCOPY WITH PROPOFOL N/A 05/05/2021   Procedure: COLONOSCOPY WITH PROPOFOL;  Surgeon: Jonathon Bellows, MD;  Location: [facility] ENDOSCOPY;  Service: Gastroenterology;  Laterality: N/A;   FNA of breast Right 11/2007   benign   WISDOM TOOTH EXTRACTION      Family History  Problem Relation Age of Onset   Hypertension Mother    Prostate cancer Maternal Uncle        about 39, tested   Lung cancer Maternal Uncle    Dementia Father    Breast cancer Neg Hx    Ovarian cancer Neg Hx    Diabetes Neg Hx     Social History   Socioeconomic History    Marital status: Married    Spouse name: Not on file   Number of children: 0   Years of education: 14   Highest education level: Not on file  Occupational History   Occupation: Patient Coordinator/ [employer]  Tobacco Use   Smoking status: Never   Smokeless tobacco: Never  Vaping Use   Vaping Use: Never used  Substance and Sexual Activity   Alcohol use: No   Drug use: No   Sexual activity: Yes    Partners: Male    Birth control/protection: Pill  Other Topics Concern   Not on file  Social History Narrative   Not on file   Social Determinants of Health   Financial Resource Strain: Not on file  Food Insecurity: Not on file  Transportation Needs: Not on file  Physical Activity: Inactive (01/01/2018)   Exercise Vital Sign    Days of Exercise per Week: 0 days    Minutes of Exercise per Session: 0 min  Stress: Not on file  Social Connections: Not on file  Intimate Partner Violence: Not on file     Current Outpatient Medications:    azelastine (ASTELIN) 0.1 % nasal spray, 2 sprays into each nostril Two (2) times a day. Use in each nostril as directed, Disp: , Rfl:    benzonatate (TESSALON) 200 MG capsule, Take by mouth., Disp: , Rfl:    Boric Acid CRYS, Place 600  mgm in size 0 capsules vaginally qhs x 7days prn yeast infection symptoms, Disp: 500 g, Rfl: 6   calcium-vitamin D (OSCAL WITH D) 500-200 MG-UNIT tablet, Take 1 tablet by mouth daily with breakfast., Disp: , Rfl:    clotrimazole-betamethasone (LOTRISONE) cream, Apply 1 application topically 2 (two) times daily. Prn sx up to 2 wks, Disp: 45 g, Rfl: 0   Cyanocobalamin (VITAMIN B 12 PO), Take 500 mcg by mouth daily. , Disp: , Rfl:    fluticasone (FLONASE) 50 MCG/ACT nasal spray, 2 sprays into each nostril two (2) times a day., Disp: , Rfl:    hydrochlorothiazide (HYDRODIURIL) 12.5 MG tablet, , Disp: , Rfl:    hydrocortisone 2.5 % cream, Apply topically., Disp: , Rfl:    JUNEL FE 1/20 1-20 MG-MCG tablet, TAKE 1 TABLET BY MOUTH  EVERY DAY, Disp: 84 tablet, Rfl: 3   loratadine (CLARITIN) 10 MG tablet, Take 10 mg by mouth daily., Disp: , Rfl:    loteprednol (ALREX) 0.2 % SUSP, SHAKE LIQUID AND INSTILL 1 DROP IN BOTH EYES THREE TIMES DAILY, Disp: , Rfl:    Multiple Vitamin (MULTI-VITAMINS) TABS, Take by mouth., Disp: , Rfl:    nystatin powder, Apply topically 2 (two) times daily as needed., Disp: 30 g, Rfl: 0   nystatin-triamcinolone ointment (MYCOLOG), Apply 1 application topically 2 (two) times daily as needed. To vulva, Disp: 30 g, Rfl: 0   omeprazole (PRILOSEC) 20 MG capsule, TAKE ONE CAPSULE BY MOUTH TWICE A DAY, Disp: , Rfl:    potassium chloride SA (K-DUR,KLOR-CON) 20 MEQ tablet, Take 10 mEq by mouth daily. , Disp: , Rfl:    Sulfacetamide Sodium, Acne, 10 % LOTN, Apply 1 application topically 2 (two) times daily., Disp: , Rfl:    valACYclovir (VALTREX) 500 MG tablet, Take 1 tablet (500 mg total) by mouth daily., Disp: 90 tablet, Rfl: 3     ROS:  Review of Systems BREAST: No symptoms    Objective: There were no vitals taken for this visit.   OBGyn Exam  Results: No results found for this or any previous visit (from the past 24 hour(s)).  Assessment/Plan:  No diagnosis found.   No orders of the defined types were placed in this encounter.           GYN counsel {counseling: 16159}    F/U  No follow-ups on file.  Clovis Warwick B. Doral Digangi, PA-C 08/29/2022 2:42 PM

## 2022-08-30 ENCOUNTER — Ambulatory Visit (INDEPENDENT_AMBULATORY_CARE_PROVIDER_SITE_OTHER): Payer: BC Managed Care – PPO | Admitting: Obstetrics and Gynecology

## 2022-08-30 ENCOUNTER — Encounter: Payer: Self-pay | Admitting: Obstetrics and Gynecology

## 2022-08-30 VITALS — BP 110/68 | Ht 67.0 in | Wt 207.0 lb

## 2022-08-30 DIAGNOSIS — N760 Acute vaginitis: Secondary | ICD-10-CM

## 2022-08-30 DIAGNOSIS — Z3041 Encounter for surveillance of contraceptive pills: Secondary | ICD-10-CM

## 2022-08-30 DIAGNOSIS — Z01411 Encounter for gynecological examination (general) (routine) with abnormal findings: Secondary | ICD-10-CM | POA: Diagnosis not present

## 2022-08-30 DIAGNOSIS — A6004 Herpesviral vulvovaginitis: Secondary | ICD-10-CM

## 2022-08-30 DIAGNOSIS — Z1231 Encounter for screening mammogram for malignant neoplasm of breast: Secondary | ICD-10-CM

## 2022-08-30 DIAGNOSIS — Z01419 Encounter for gynecological examination (general) (routine) without abnormal findings: Secondary | ICD-10-CM

## 2022-08-30 MED ORDER — FLUCONAZOLE 150 MG PO TABS: 150.0000 mg | ORAL_TABLET | Freq: Once | ORAL | 1 refills | Status: AC

## 2022-08-30 MED ORDER — VALACYCLOVIR HCL 500 MG PO TABS: 500.0000 mg | ORAL_TABLET | Freq: Every day | ORAL | 3 refills | Status: DC

## 2022-08-30 MED ORDER — NORETHIN ACE-ETH ESTRAD-FE 1-20 MG-MCG PO TABS: 1.0000 | ORAL_TABLET | Freq: Every day | ORAL | 3 refills | Status: DC

## 2022-08-30 NOTE — Patient Instructions (Addendum)
I value your feedback and you entrusting us with your care. If you get a [facility] patient survey, I would appreciate you taking the time to let us know about your experience today. Thank you!  [facility]: [phone]      

## 2022-09-26 ENCOUNTER — Telehealth: Payer: Self-pay

## 2022-09-26 MED ORDER — FLUCONAZOLE 150 MG PO TABS: 150.0000 mg | ORAL_TABLET | Freq: Once | ORAL | 0 refills | Status: AC

## 2022-09-26 NOTE — Telephone Encounter (Signed)
Pt calling for refill of diflucan; is prone to yeast inf; sxs of mainly itching, little d/c.  Pharm correct in chart; per note 08/30/22 diflucan eRx'd x1 dose. Pt aware.

## 2022-11-19 ENCOUNTER — Other Ambulatory Visit: Payer: Self-pay

## 2022-11-19 DIAGNOSIS — N760 Acute vaginitis: Secondary | ICD-10-CM

## 2022-11-19 DIAGNOSIS — N898 Other specified noninflammatory disorders of vagina: Secondary | ICD-10-CM

## 2022-11-19 MED ORDER — FLUCONAZOLE 150 MG PO TABS: 150.0000 mg | ORAL_TABLET | Freq: Every day | ORAL | 0 refills | Status: DC

## 2022-11-19 MED ORDER — NYSTATIN 100000 UNIT/GM EX POWD: Freq: Two times a day (BID) | CUTANEOUS | 0 refills | Status: DC | PRN

## 2022-11-20 ENCOUNTER — Telehealth: Payer: Self-pay

## 2022-11-20 NOTE — Telephone Encounter (Signed)
Patient calling to follow up on request for refill of norethindrone-ethinyl estradiol-FE (JUNEL FE 1/20) 1-20 MG-MCG tablet  States she called yesterday. She thought this was taken care of at her annual visit on 08/30/2022. Reviewed chart. Advised 3 mo rx w/3 refills sent on 08/30/22. Receipt confirmed by pharmacy: [facility]. Patient reports they don't always have this medication, so [facility]. Advised she can have [facility] transfer [facility] if they are unable to supply her with the medication. She will contact pharmacy for refill.

## 2022-12-12 IMAGING — MG MM DIGITAL SCREENING BILAT W/ TOMO AND CAD
6 of 10 series · 6 of 30 positions shown · non-contrast
Comparison: Previous exam(s).

CLINICAL DATA: Screening.

EXAM:
DIGITAL SCREENING BILATERAL MAMMOGRAM WITH TOMOSYNTHESIS AND CAD
TECHNIQUE: Bilateral screening digital craniocaudal and mediolateral oblique
mammograms were obtained. Bilateral screening digital breast
tomosynthesis was performed. The images were evaluated with
computer-aided detection.

[R MLO synth-2D]
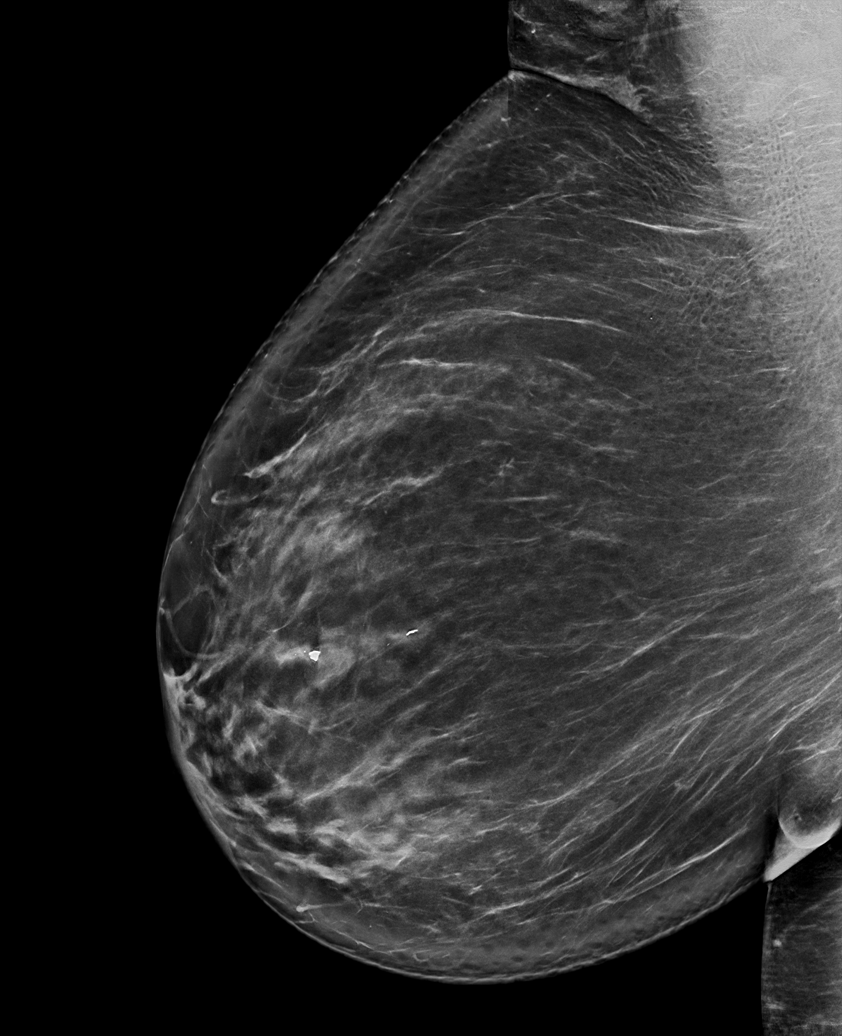

[R CC synth-2D]
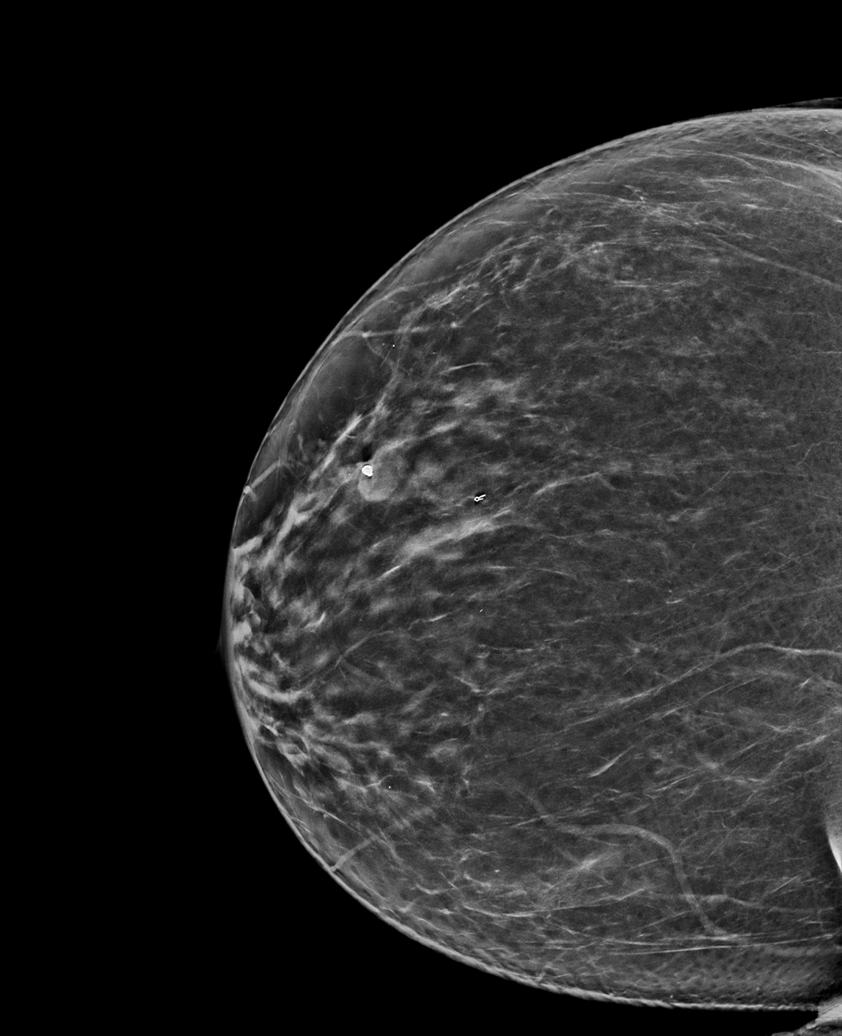

[L CC synth-2D]
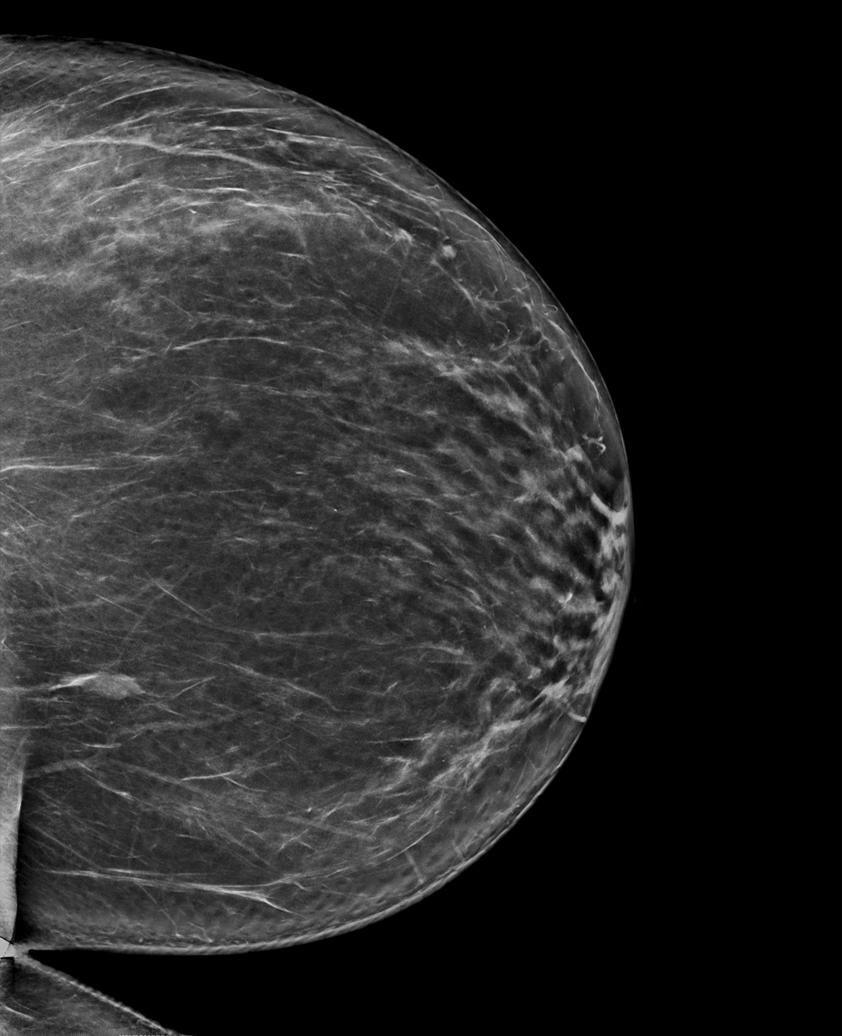

[R CV synth-2D]
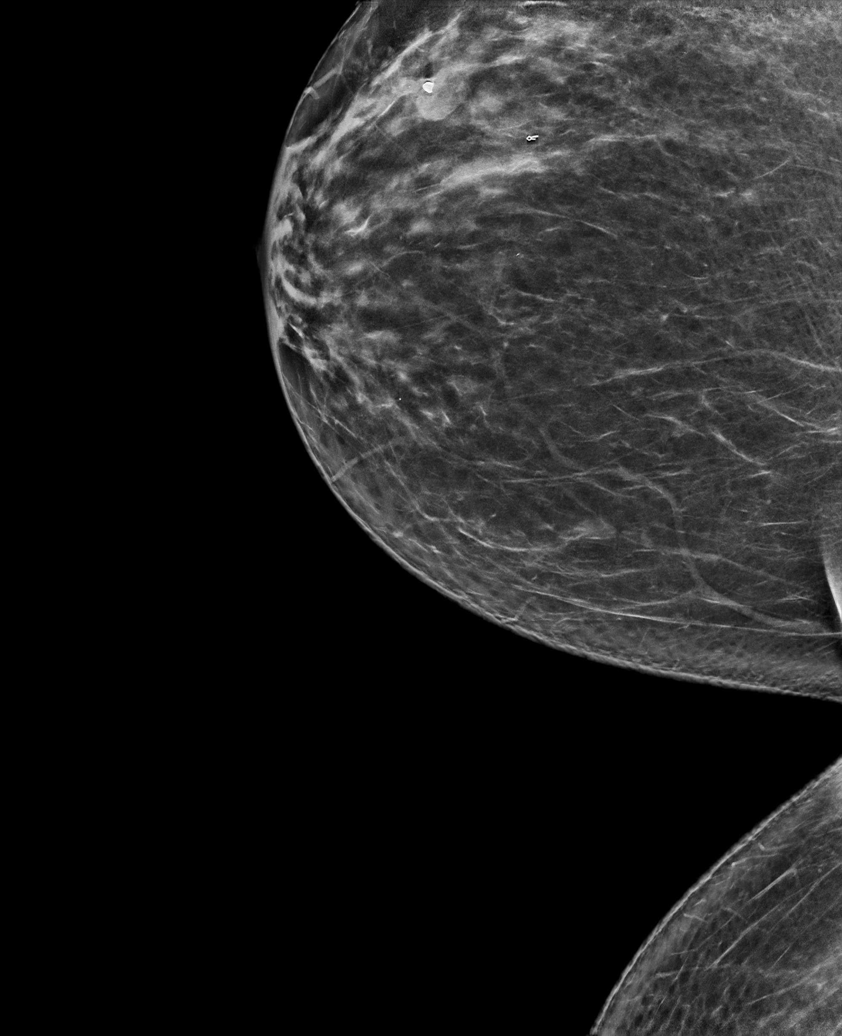

[L MLO synth-2D]
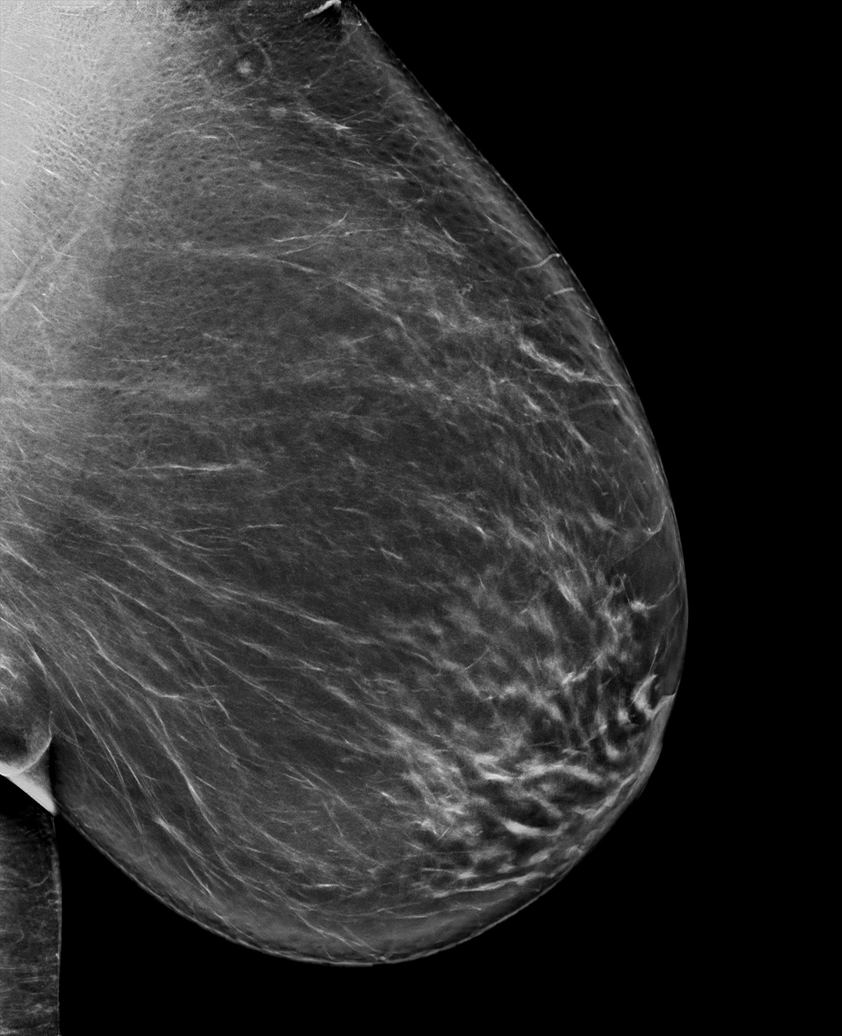

[L MLO tomo · tomo slice 51/102.0]
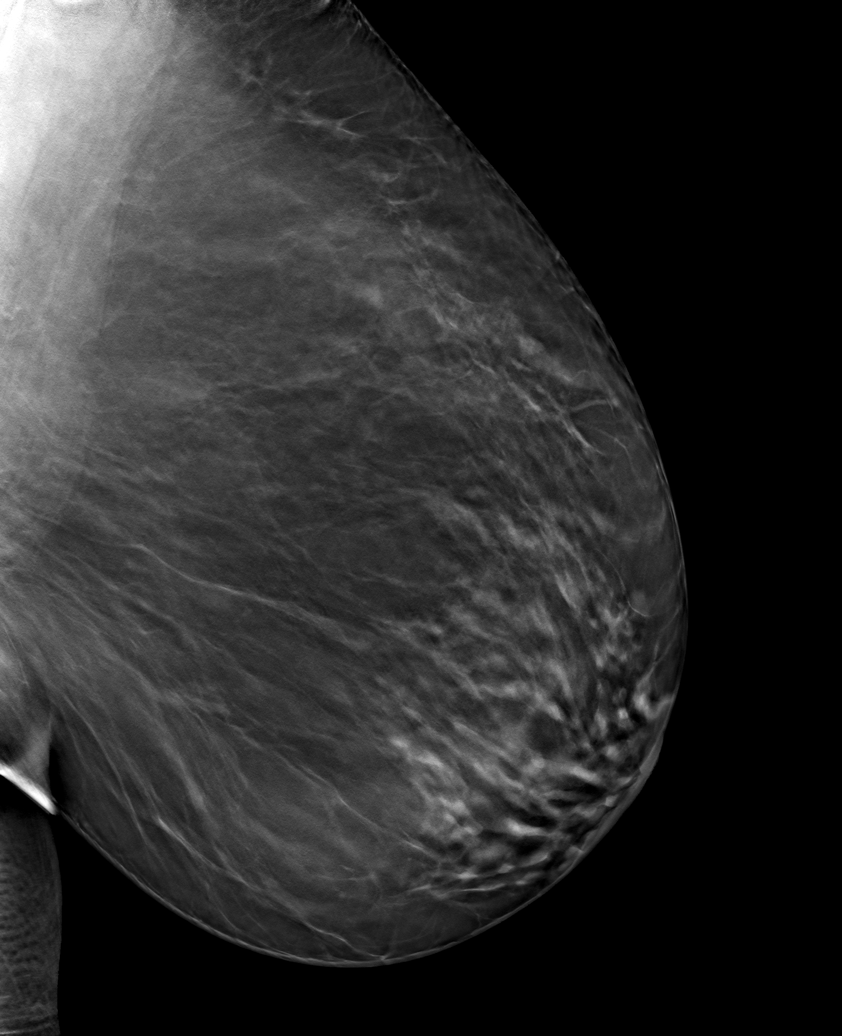

[6 of 30 positions shown; findings below may reference images not displayed]

ACR Breast Density Category b: There are scattered areas of
fibroglandular density.
FINDINGS: There are no findings suspicious for malignancy.
IMPRESSION: No mammographic evidence of malignancy. A result letter of this
screening mammogram will be mailed directly to the patient.

RECOMMENDATION:
Screening mammogram in one year. (Code:51-O-LD2)

BI-RADS CATEGORY  1: Negative.

## 2023-01-18 ENCOUNTER — Ambulatory Visit
Admission: RE | Admit: 2023-01-18 | Discharge: 2023-01-18 | Disposition: A | Discharge status: Home / Self Care | Payer: BC Managed Care – PPO | Source: Ambulatory Visit | Attending: Obstetrics and Gynecology | Admitting: Obstetrics and Gynecology

## 2023-01-18 DIAGNOSIS — Z1231 Encounter for screening mammogram for malignant neoplasm of breast: Secondary | ICD-10-CM | POA: Insufficient documentation

## 2023-01-31 ENCOUNTER — Other Ambulatory Visit: Payer: Self-pay | Admitting: Unknown Physician Specialty

## 2023-02-12 LAB — SURGICAL PATHOLOGY

## 2023-03-18 ENCOUNTER — Other Ambulatory Visit: Payer: Self-pay

## 2023-03-18 DIAGNOSIS — N760 Acute vaginitis: Secondary | ICD-10-CM

## 2023-03-18 DIAGNOSIS — N898 Other specified noninflammatory disorders of vagina: Secondary | ICD-10-CM

## 2023-03-18 MED ORDER — FLUCONAZOLE 150 MG PO TABS
150.0000 mg | ORAL_TABLET | Freq: Every day | ORAL | 0 refills | Status: DC
Start: 2023-03-18 — End: 2023-05-20

## 2023-05-20 ENCOUNTER — Other Ambulatory Visit: Payer: Self-pay | Admitting: Obstetrics and Gynecology

## 2023-05-20 DIAGNOSIS — N898 Other specified noninflammatory disorders of vagina: Secondary | ICD-10-CM

## 2023-05-20 DIAGNOSIS — N760 Acute vaginitis: Secondary | ICD-10-CM

## 2023-05-20 MED ORDER — FLUCONAZOLE 150 MG PO TABS
150.0000 mg | ORAL_TABLET | Freq: Every day | ORAL | 0 refills | Status: AC
Start: 2023-05-20 — End: ?

## 2023-05-24 ENCOUNTER — Other Ambulatory Visit: Payer: Self-pay | Admitting: Obstetrics and Gynecology

## 2023-05-24 DIAGNOSIS — Z3041 Encounter for surveillance of contraceptive pills: Secondary | ICD-10-CM

## 2023-05-24 MED ORDER — NORETHIN ACE-ETH ESTRAD-FE 1-20 MG-MCG PO TABS
1.0000 | ORAL_TABLET | Freq: Every day | ORAL | 0 refills | Status: DC
Start: 2023-05-24 — End: 2023-09-02

## 2023-05-24 NOTE — Addendum Note (Signed)
Addended by: Donnetta Hail on: 05/24/2023 01:47 PM   Modules accepted: Orders

## 2023-05-24 NOTE — Telephone Encounter (Signed)
Pt is calling to request [facility] RF, has annual schedule for 09/02/23 ABC. Sounds like appropriate refills were not transferred correctly back on 11/2022 from [facility]. RF's sent, pt aware.

## 2023-07-02 ENCOUNTER — Telehealth: Payer: Self-pay

## 2023-07-02 DIAGNOSIS — N760 Acute vaginitis: Secondary | ICD-10-CM

## 2023-07-02 DIAGNOSIS — N898 Other specified noninflammatory disorders of vagina: Secondary | ICD-10-CM

## 2023-07-02 MED ORDER — FLUCONAZOLE 150 MG PO TABS
150.0000 mg | ORAL_TABLET | Freq: Every day | ORAL | 1 refills | Status: DC
Start: 2023-07-02 — End: 2023-09-02

## 2023-07-02 NOTE — Telephone Encounter (Signed)
TRIAGE VOICEMAIL: Patient requesting a refill of fluconazole (DIFLUCAN) 150 MG tablet from [place] [facility] to be sent to [facility].

## 2023-07-02 NOTE — Telephone Encounter (Signed)
Patient states PCP Daniel Nones had her on prednisone and a steriod inhaler. She believes this caused her symptoms: itching with discharge for a few days now. She has been using the cream, but it's not helping. Discussed with Helmut Muster. Ok to send Diflucan. Patient aware.

## 2023-08-13 ENCOUNTER — Other Ambulatory Visit: Payer: Self-pay | Admitting: Obstetrics and Gynecology

## 2023-08-13 DIAGNOSIS — Z3041 Encounter for surveillance of contraceptive pills: Secondary | ICD-10-CM

## 2023-08-31 NOTE — Progress Notes (Unsigned)
PCP: Lynnea Ferrier, MD   No chief complaint on file.   HPI:      [name] is a 52 y.o. G0P0000 whose LMP was No LMP recorded. (Menstrual status: Perimenopausal)., presents today for her annual examination.  Her menses are infrequent on OCPs, lasting 2 days, light flow, on placebo pill. No BTB, no dysmen, occas night sweats.   Sex activity: single partner, contraception - OCP (estrogen/progesterone). She does not have vaginal dryness.  Last Pap: 05/29/19 Results were: no abnormalities /neg HPV DNA. No hx of abn paps.  Hx of genital HSV, takes valtrex daily.   Last mammogram: 01/18/23 Results were: normal--routine follow-up in 12 months There is no FH of breast cancer. There is no FH of ovarian cancer. The patient does not do self-breast exams.  Colonoscopy: 8/22 with [facility];  Repeat due after 10 years.   Tobacco use: The patient denies current or previous tobacco use. Alcohol use: none No drug use Exercise: not active  She does get adequate calcium and Vitamin D in her diet.  Labs with PCP. Pt needs Rx RF diflucan. Hx of recurrent vaginitis sx. No sx today  Patient Active Problem List   Diagnosis Date Noted   Pure hypercholesterolemia 12/15/2019   Genital herpes 05/29/2019   Recurrent vaginitis 01/06/2018   Hyperlipidemia    Edema    Obesity    Dyspepsia     Past Surgical History:  Procedure Laterality Date   BREAST BIOPSY Right 2005   Core Bx -fibroadenoma/ Dr Lemar Livings   COLONOSCOPY WITH PROPOFOL N/A 05/05/2021   Procedure: COLONOSCOPY WITH PROPOFOL;  Surgeon: Wyline Mood, MD;  Location: [facility] ENDOSCOPY;  Service: Gastroenterology;  Laterality: N/A;   FNA of breast Right 11/2007   benign   WISDOM TOOTH EXTRACTION      Family History  Problem Relation Age of Onset   Hypertension Mother    Prostate cancer Maternal Uncle        about 105, tested   Lung cancer Maternal Uncle    Dementia Father    Breast cancer Neg Hx    Ovarian cancer Neg Hx     Diabetes Neg Hx     Social History   Socioeconomic History   Marital status: Married    Spouse name: Not on file   Number of children: 0   Years of education: 14   Highest education level: Not on file  Occupational History   Occupation: Patient Coordinator/ [employer]  Tobacco Use   Smoking status: Never   Smokeless tobacco: Never  Vaping Use   Vaping status: Never Used  Substance and Sexual Activity   Alcohol use: No   Drug use: No   Sexual activity: Yes    Partners: Male    Birth control/protection: Pill  Other Topics Concern   Not on file  Social History Narrative   Not on file   Social Determinants of Health   Financial Resource Strain: Low Risk  (02/01/2023)   Received from [facility], [facility]   Overall Financial Resource Strain (CARDIA)    Difficulty of Paying Living Expenses: Not hard at all  Food Insecurity: No Food Insecurity (02/01/2023)   Received from [facility], [facility]   Hunger Vital Sign    Worried About Running Out of Food in the Last Year: Never true    Ran Out of Food in the Last Year: Never true  Transportation Needs:  No Transportation Needs (02/01/2023)   Received from [facility], [facility]   [facility] - Transportation    In the past 12 months, has lack of transportation kept you from medical appointments or from getting medications?: No    Lack of Transportation (Non-Medical): No  Physical Activity: Inactive (01/01/2018)   Exercise Vital Sign    Days of Exercise per Week: 0 days    Minutes of Exercise per Session: 0 min  Stress: Not on file  Social Connections: Not on file  Intimate Partner Violence: Not on file     Current Outpatient Medications:    azelastine (ASTELIN) 0.1 % nasal spray, 2 sprays into each nostril Two (2) times a day. Use in each nostril as directed, Disp: , Rfl:    Boric Acid CRYS, Place 600 mgm in size 0  capsules vaginally qhs x 7days prn yeast infection symptoms, Disp: 500 g, Rfl: 6   Budesonide 90 MCG/ACT inhaler, Inhale into the lungs., Disp: , Rfl:    calcium-vitamin D (OSCAL WITH D) 500-200 MG-UNIT tablet, Take 1 tablet by mouth daily with breakfast., Disp: , Rfl:    clotrimazole-betamethasone (LOTRISONE) cream, Apply 1 application topically 2 (two) times daily. Prn sx up to 2 wks, Disp: 45 g, Rfl: 0   Cyanocobalamin (VITAMIN B 12 PO), Take 500 mcg by mouth daily. , Disp: , Rfl:    fluconazole (DIFLUCAN) 150 MG tablet, Take 1 tablet (150 mg total) by mouth daily., Disp: 1 tablet, Rfl: 1   fluticasone (FLONASE) 50 MCG/ACT nasal spray, 2 sprays into each nostril two (2) times a day., Disp: , Rfl:    hydrochlorothiazide (HYDRODIURIL) 12.5 MG tablet, , Disp: , Rfl:    hydrocortisone 2.5 % cream, Apply topically., Disp: , Rfl:    loratadine (CLARITIN) 10 MG tablet, Take 10 mg by mouth daily., Disp: , Rfl:    Multiple Vitamin (MULTI-VITAMINS) TABS, Take by mouth., Disp: , Rfl:    norethindrone-ethinyl estradiol-FE (JUNEL FE 1/20) 1-20 MG-MCG tablet, Take 1 tablet by mouth daily., Disp: 84 tablet, Rfl: 0   nystatin-triamcinolone ointment (MYCOLOG), Apply 1 application topically 2 (two) times daily as needed. To vulva, Disp: 30 g, Rfl: 0   omeprazole (PRILOSEC) 20 MG capsule, TAKE ONE CAPSULE BY MOUTH TWICE A DAY, Disp: , Rfl:    pantoprazole (PROTONIX) 40 MG tablet, Take 40 mg by mouth 2 (two) times daily., Disp: , Rfl:    potassium chloride SA (K-DUR,KLOR-CON) 20 MEQ tablet, Take 10 mEq by mouth daily. , Disp: , Rfl:    valACYclovir (VALTREX) 500 MG tablet, Take 1 tablet (500 mg total) by mouth daily., Disp: 90 tablet, Rfl: 3     ROS:  Review of Systems  Constitutional:  Negative for fatigue, fever and unexpected weight change.  Respiratory:  Negative for cough, shortness of breath and wheezing.   Cardiovascular:  Negative for chest pain, palpitations and leg swelling.  Gastrointestinal:   Negative for blood in stool, constipation, diarrhea, nausea and vomiting.  Endocrine: Negative for cold intolerance, heat intolerance and polyuria.  Genitourinary:  Negative for dyspareunia, dysuria, flank pain, frequency, genital sores, hematuria, menstrual problem, pelvic pain, urgency, vaginal bleeding, vaginal discharge and vaginal pain.  Musculoskeletal:  Negative for back pain, joint swelling and myalgias.  Skin:  Negative for rash.  Neurological:  Negative for dizziness, syncope, light-headedness, numbness and headaches.  Hematological:  Negative for adenopathy.  Psychiatric/Behavioral:  Negative for agitation, confusion, sleep disturbance and suicidal ideas. The patient is  not nervous/anxious.    BREAST: No symptoms    Objective: There were no vitals taken for this visit.   Physical Exam Constitutional:      Appearance: She is well-developed.  Genitourinary:     Vulva normal.     Right Labia: No rash, tenderness or lesions.    Left Labia: No tenderness, lesions or rash.    No vaginal discharge, erythema or tenderness.      Right Adnexa: not tender and no mass present.    Left Adnexa: not tender and no mass present.    No cervical friability or polyp.     Uterus is not enlarged or tender.  Breasts:    Right: No mass, nipple discharge, skin change or tenderness.     Left: No mass, nipple discharge, skin change or tenderness.  Neck:     Thyroid: No thyromegaly.  Cardiovascular:     Rate and Rhythm: Normal rate and regular rhythm.     Heart sounds: Normal heart sounds. No murmur heard. Pulmonary:     Effort: Pulmonary effort is normal.     Breath sounds: Normal breath sounds.  Abdominal:     Palpations: Abdomen is soft.     Tenderness: There is no abdominal tenderness. There is no guarding or rebound.  Musculoskeletal:        General: Normal range of motion.     Cervical back: Normal range of motion.  Lymphadenopathy:     Cervical: No cervical adenopathy.   Neurological:     General: No focal deficit present.     Mental Status: She is alert and oriented to person, place, and time.     Cranial Nerves: No cranial nerve deficit.  Skin:    General: Skin is warm and dry.  Psychiatric:        Mood and Affect: Mood normal.        Behavior: Behavior normal.        Thought Content: Thought content normal.        Judgment: Judgment normal.  Vitals reviewed.     Assessment/Plan:  Encounter for annual routine gynecological examination  Encounter for birth control pills maintenance - Plan: norethindrone-ethinyl estradiol-FE (JUNEL FE 1/20) 1-20 MG-MCG tablet; OCP RF. Discussed stopping OCPs vs re-eval next yr given pt's age. Pt wants to continue for now.   Encounter for screening mammogram for malignant neoplasm of breast - Plan: MM 3D SCREEN BREAST BILATERAL; pt to schedule mammo  Recurrent vaginitis - Plan: fluconazole (DIFLUCAN) 150 MG tablet; hx of sx, no sx today. Rx RF diflucan prn.   Herpes simplex vulvovaginitis - Plan: valACYclovir (VALTREX) 500 MG tablet; takes daily, Rx RF.   No orders of the defined types were placed in this encounter.           GYN counsel breast self exam, mammography screening, menopause, adequate intake of calcium and vitamin D, diet and exercise    F/U  No follow-ups on file.  Maleiyah Releford B. Howie Rufus, PA-C 08/31/2023 5:03 PM

## 2023-09-02 ENCOUNTER — Ambulatory Visit (INDEPENDENT_AMBULATORY_CARE_PROVIDER_SITE_OTHER): Payer: BC Managed Care – PPO | Admitting: Obstetrics and Gynecology

## 2023-09-02 ENCOUNTER — Other Ambulatory Visit: Payer: Self-pay | Admitting: Obstetrics and Gynecology

## 2023-09-02 ENCOUNTER — Encounter: Payer: Self-pay | Admitting: Obstetrics and Gynecology

## 2023-09-02 ENCOUNTER — Other Ambulatory Visit (HOSPITAL_COMMUNITY)
Admission: RE | Admit: 2023-09-02 | Discharge: 2023-09-02 | Disposition: A | Discharge status: Home / Self Care | Payer: BC Managed Care – PPO | Source: Ambulatory Visit | Attending: Obstetrics and Gynecology | Admitting: Obstetrics and Gynecology

## 2023-09-02 VITALS — BP 121/66 | HR 85 | Ht 67.0 in | Wt 205.0 lb

## 2023-09-02 DIAGNOSIS — N760 Acute vaginitis: Secondary | ICD-10-CM

## 2023-09-02 DIAGNOSIS — Z124 Encounter for screening for malignant neoplasm of cervix: Secondary | ICD-10-CM | POA: Diagnosis present

## 2023-09-02 DIAGNOSIS — Z01419 Encounter for gynecological examination (general) (routine) without abnormal findings: Secondary | ICD-10-CM

## 2023-09-02 DIAGNOSIS — A6004 Herpesviral vulvovaginitis: Secondary | ICD-10-CM

## 2023-09-02 DIAGNOSIS — N898 Other specified noninflammatory disorders of vagina: Secondary | ICD-10-CM

## 2023-09-02 DIAGNOSIS — Z1151 Encounter for screening for human papillomavirus (HPV): Secondary | ICD-10-CM | POA: Insufficient documentation

## 2023-09-02 DIAGNOSIS — Z3041 Encounter for surveillance of contraceptive pills: Secondary | ICD-10-CM

## 2023-09-02 DIAGNOSIS — Z1231 Encounter for screening mammogram for malignant neoplasm of breast: Secondary | ICD-10-CM

## 2023-09-02 MED ORDER — NORETHIN ACE-ETH ESTRAD-FE 1-20 MG-MCG PO TABS: 1.0000 | ORAL_TABLET | Freq: Every day | ORAL | 3 refills | Status: DC

## 2023-09-02 MED ORDER — VALACYCLOVIR HCL 500 MG PO TABS: 500.0000 mg | ORAL_TABLET | Freq: Every day | ORAL | 3 refills | Status: DC

## 2023-09-02 MED ORDER — FLUCONAZOLE 150 MG PO TABS
150.0000 mg | ORAL_TABLET | Freq: Every day | ORAL | 1 refills | Status: DC
Start: 2023-09-02 — End: 2024-02-19

## 2023-09-02 NOTE — Patient Instructions (Signed)
I value your feedback and you entrusting us with your care. If you get a [facility] patient survey, I would appreciate you taking the time to let us know about your experience today. Thank you! ? ? ?

## 2023-09-04 LAB — CYTOLOGY - PAP
Adequacy: ABSENT
Comment: NEGATIVE
Diagnosis: NEGATIVE
High risk HPV: NEGATIVE

## 2024-01-21 ENCOUNTER — Ambulatory Visit
Admission: RE | Admit: 2024-01-21 | Discharge: 2024-01-21 | Disposition: A | Discharge status: Home / Self Care | Payer: Self-pay | Source: Ambulatory Visit | Attending: Obstetrics and Gynecology | Admitting: Obstetrics and Gynecology

## 2024-01-21 DIAGNOSIS — Z1231 Encounter for screening mammogram for malignant neoplasm of breast: Secondary | ICD-10-CM | POA: Diagnosis present

## 2024-01-23 ENCOUNTER — Encounter: Payer: Self-pay | Admitting: Obstetrics and Gynecology

## 2024-02-19 ENCOUNTER — Other Ambulatory Visit: Payer: Self-pay

## 2024-02-19 ENCOUNTER — Telehealth: Payer: Self-pay

## 2024-02-19 DIAGNOSIS — N760 Acute vaginitis: Secondary | ICD-10-CM

## 2024-02-19 DIAGNOSIS — N898 Other specified noninflammatory disorders of vagina: Secondary | ICD-10-CM

## 2024-02-19 MED ORDER — FLUCONAZOLE 150 MG PO TABS: 150.0000 mg | ORAL_TABLET | Freq: Every day | ORAL | 1 refills | Status: DC

## 2024-02-19 NOTE — Telephone Encounter (Signed)
 Received call on triage line from patient requesting refill of Diflucan .  Patient reports recent onset of vaginal itching.  Alicia Copland PA-C prescribes this for her as needed.  Rx sent to the [facility].  Liane Redman RN

## 2024-05-05 ENCOUNTER — Telehealth: Payer: Self-pay

## 2024-05-05 ENCOUNTER — Other Ambulatory Visit: Payer: Self-pay | Admitting: Obstetrics and Gynecology

## 2024-05-05 DIAGNOSIS — N898 Other specified noninflammatory disorders of vagina: Secondary | ICD-10-CM

## 2024-05-05 DIAGNOSIS — N760 Acute vaginitis: Secondary | ICD-10-CM

## 2024-05-05 MED ORDER — FLUCONAZOLE 150 MG PO TABS: 150.0000 mg | ORAL_TABLET | Freq: Every day | ORAL | 1 refills | Status: DC

## 2024-05-05 NOTE — Telephone Encounter (Signed)
 Rx RF eRxd to [facility]. Ask pt if taking probiotics? If not, she should start (any OTC ones are fine, make sure bottle not expired, follow dosing directions on bottle).

## 2024-05-05 NOTE — Telephone Encounter (Signed)
 Pt aware. Already taking probiotics.

## 2024-05-05 NOTE — Telephone Encounter (Signed)
 Randine called triage line stating she thinks she has another yeast infection and wants to know if you can send in Diflucan  again? Do you approve of this?

## 2024-05-05 NOTE — Progress Notes (Signed)
Rx RF diflucan for yeast vag sx 

## 2024-08-22 ENCOUNTER — Other Ambulatory Visit: Payer: Self-pay | Admitting: Obstetrics and Gynecology

## 2024-08-22 DIAGNOSIS — Z3041 Encounter for surveillance of contraceptive pills: Secondary | ICD-10-CM

## 2024-09-02 ENCOUNTER — Encounter: Payer: Self-pay | Admitting: Obstetrics and Gynecology

## 2024-09-02 ENCOUNTER — Ambulatory Visit: Admitting: Obstetrics and Gynecology

## 2024-09-02 VITALS — BP 96/64 | HR 96 | Ht 68.0 in | Wt 221.0 lb

## 2024-09-02 DIAGNOSIS — Z1231 Encounter for screening mammogram for malignant neoplasm of breast: Secondary | ICD-10-CM

## 2024-09-02 DIAGNOSIS — A6004 Herpesviral vulvovaginitis: Secondary | ICD-10-CM | POA: Diagnosis not present

## 2024-09-02 DIAGNOSIS — Z3041 Encounter for surveillance of contraceptive pills: Secondary | ICD-10-CM

## 2024-09-02 DIAGNOSIS — N760 Acute vaginitis: Secondary | ICD-10-CM | POA: Diagnosis not present

## 2024-09-02 DIAGNOSIS — Z01419 Encounter for gynecological examination (general) (routine) without abnormal findings: Secondary | ICD-10-CM

## 2024-09-02 DIAGNOSIS — Z01411 Encounter for gynecological examination (general) (routine) with abnormal findings: Secondary | ICD-10-CM | POA: Diagnosis not present

## 2024-09-02 MED ORDER — VALACYCLOVIR HCL 500 MG PO TABS: 500.0000 mg | ORAL_TABLET | Freq: Every day | ORAL | 3 refills | Status: AC

## 2024-09-02 MED ORDER — FLUCONAZOLE 150 MG PO TABS: 150.0000 mg | ORAL_TABLET | Freq: Every day | ORAL | 2 refills | Status: AC

## 2024-09-02 NOTE — Progress Notes (Signed)
 PCP: Fernande Ophelia JINNY DOUGLAS, MD   Chief Complaint  Patient presents with   Gynecologic Exam    No concerns    HPI:      Roberta Sanchez is a 53 y.o. G0P0000 whose LMP was No LMP recorded. (Menstrual status: Perimenopausal)., presents today for her annual examination.  Her menses were infrequent on OCPs earlier this yr, light flow for 1 day, then no menses for a couple months. Stopped OCPs 2 yrs ago due to same sx and then periods resumed so pt restarted OCPs. Willing to go off to see what cycles do. Occas VS sx.   Sex activity: single partner, contraception - OCP (estrogen/progesterone). She does not have vaginal dryness/pain/bleeding.  Last Pap: 09/02/23 Results were: no abnormalities /neg HPV DNA. No hx of abn paps.  Hx of genital HSV, takes valtrex  daily with sx control.   Last mammogram: 01/21/24 Results were: normal--routine follow-up in 12 months There is no FH of breast cancer. There is no FH of ovarian cancer. The patient does self-breast exams.  Colonoscopy: 8/22 with [facility];  Repeat due after 10 years.   Tobacco use: The patient denies current or previous tobacco use. Alcohol use: none No drug use Exercise: not active  She does get adequate calcium and Vitamin D in her diet.  Labs with PCP. Pt needs Rx RF diflucan . Hx of recurrent vaginitis sx. No sx today/recently.   Patient Active Problem List   Diagnosis Date Noted   Pure hypercholesterolemia 12/15/2019   Genital herpes 05/29/2019   Recurrent vaginitis 01/06/2018   Hyperlipidemia    Edema    Obesity    Dyspepsia     Past Surgical History:  Procedure Laterality Date   BREAST BIOPSY Right 2005   Core Bx -fibroadenoma/ Dr Dessa   COLONOSCOPY WITH PROPOFOL  N/A 05/05/2021   Procedure: COLONOSCOPY WITH PROPOFOL ;  Surgeon: Therisa Bi, MD;  Location: [facility] ENDOSCOPY;  Service: Gastroenterology;  Laterality: N/A;   FNA of breast Right 11/2007   benign   WISDOM TOOTH EXTRACTION      Family History   Problem Relation Age of Onset   Hypertension Mother    Prostate cancer Maternal Uncle        about 24, tested   Lung cancer Maternal Uncle    Dementia Father    Breast cancer Neg Hx    Ovarian cancer Neg Hx    Diabetes Neg Hx     Social History   Socioeconomic History   Marital status: Married    Spouse name: Not on file   Number of children: 0   Years of education: 14   Highest education level: Not on file  Occupational History   Occupation: Patient Coordinator/ [employer]  Tobacco Use   Smoking status: Never   Smokeless tobacco: Never  Vaping Use   Vaping status: Never Used  Substance and Sexual Activity   Alcohol use: No   Drug use: No   Sexual activity: Yes    Partners: Male    Birth control/protection: Pill  Other Topics Concern   Not on file  Social History Narrative   Not on file   Social Drivers of Health   Financial Resource Strain: Low Risk  (02/14/2024)   Received from [facility]   Overall Financial Resource Strain (CARDIA)    Difficulty of Paying Living Expenses: Not hard at all  Food Insecurity: No Food Insecurity (02/14/2024)   Received from [facility]  Hunger Vital Sign    Within the past 12 months, you worried that your food would run out before you got the money to buy more.: Never true    Within the past 12 months, the food you bought just didn't last and you didn't have money to get more.: Never true  Transportation Needs: No Transportation Needs (02/14/2024)   Received from [facility] - Transportation    In the past 12 months, has lack of transportation kept you from medical appointments or from getting medications?: No    Lack of Transportation (Non-Medical): No  Physical Activity: Inactive (01/01/2018)   Exercise Vital Sign    Days of Exercise per Week: 0 days    Minutes of Exercise per Session: 0 min  Stress: Not on file  Social Connections: Not on file  Intimate Partner  Violence: Not on file     Current Outpatient Medications:    azelastine (ASTELIN) 0.1 % nasal spray, 2 sprays into each nostril Two (2) times a day. Use in each nostril as directed, Disp: , Rfl:    Boric Acid CRYS, Place 600 mgm in size 0 capsules vaginally qhs x 7days prn yeast infection symptoms, Disp: 500 g, Rfl: 6   budesonide-formoterol (SYMBICORT) 80-4.5 MCG/ACT inhaler, SMARTSIG:By Mouth, Disp: , Rfl:    Cholecalciferol (VITAMIN D-3 PO), Take by mouth., Disp: , Rfl:    clotrimazole -betamethasone  (LOTRISONE ) cream, Apply 1 application topically 2 (two) times daily. Prn sx up to 2 wks, Disp: 45 g, Rfl: 0   Cyanocobalamin (VITAMIN B 12 PO), Take 500 mcg by mouth daily. , Disp: , Rfl:    fluticasone (FLONASE) 50 MCG/ACT nasal spray, 2 sprays into each nostril two (2) times a day., Disp: , Rfl:    loratadine (CLARITIN) 10 MG tablet, Take 10 mg by mouth daily., Disp: , Rfl:    MAGNESIUM PO, Take 500 mg by mouth daily., Disp: , Rfl:    Multiple Vitamin (MULTI-VITAMINS) TABS, Take by mouth., Disp: , Rfl:    pantoprazole (PROTONIX) 40 MG tablet, Take 40 mg by mouth 2 (two) times daily., Disp: , Rfl:    spironolactone (ALDACTONE) 25 MG tablet, Take 25 mg by mouth daily., Disp: , Rfl:    triamcinolone  cream (KENALOG) 0.5 %, Apply topically., Disp: , Rfl:    fluconazole  (DIFLUCAN ) 150 MG tablet, Take 1 tablet (150 mg total) by mouth daily., Disp: 1 tablet, Rfl: 2   hydrocortisone 2.5 % cream, Apply topically., Disp: , Rfl:    norethindrone-ethinyl estradiol-FE (JUNEL FE 1/20) 1-20 MG-MCG tablet, TAKE 1 TABLET BY MOUTH EVERY DAY, Disp: 84 tablet, Rfl: 0   nystatin -triamcinolone  ointment (MYCOLOG), Apply 1 application topically 2 (two) times daily as needed. To vulva, Disp: 30 g, Rfl: 0   omeprazole (PRILOSEC) 20 MG capsule, TAKE ONE CAPSULE BY MOUTH TWICE A DAY, Disp: , Rfl:    potassium chloride SA (K-DUR,KLOR-CON) 20 MEQ tablet, Take 10 mEq by mouth daily. , Disp: , Rfl:    valACYclovir  (VALTREX )  500 MG tablet, Take 1 tablet (500 mg total) by mouth daily., Disp: 90 tablet, Rfl: 3     ROS:  Review of Systems  Constitutional:  Negative for fatigue, fever and unexpected weight change.  Respiratory:  Negative for cough, shortness of breath and wheezing.   Cardiovascular:  Negative for chest pain, palpitations and leg swelling.  Gastrointestinal:  Negative for blood in stool, constipation, diarrhea, nausea and vomiting.  Endocrine: Negative for cold intolerance, heat intolerance  and polyuria.  Genitourinary:  Negative for dyspareunia, dysuria, flank pain, frequency, genital sores, hematuria, menstrual problem, pelvic pain, urgency, vaginal bleeding, vaginal discharge and vaginal pain.  Musculoskeletal:  Negative for back pain, joint swelling and myalgias.  Skin:  Negative for rash.  Neurological:  Negative for dizziness, syncope, light-headedness, numbness and headaches.  Hematological:  Negative for adenopathy.  Psychiatric/Behavioral:  Negative for agitation, confusion, sleep disturbance and suicidal ideas. The patient is not nervous/anxious.    BREAST: No symptoms    Objective: BP 96/64   Pulse 96   Ht 5' 8 (1.727 m)   Wt 221 lb (100.2 kg)   BMI 33.60 kg/m    Physical Exam Constitutional:      Appearance: She is well-developed.  Genitourinary:     Vulva normal.     Right Labia: No rash, tenderness or lesions.    Left Labia: No tenderness, lesions or rash.    No vaginal discharge, erythema or tenderness.      Right Adnexa: not tender and no mass present.    Left Adnexa: not tender and no mass present.    No cervical friability or polyp.     Uterus is not enlarged or tender.  Breasts:    Right: No mass, nipple discharge, skin change or tenderness.     Left: No mass, nipple discharge, skin change or tenderness.  Neck:     Thyroid: No thyromegaly.  Cardiovascular:     Rate and Rhythm: Normal rate and regular rhythm.     Heart sounds: Normal heart sounds. No  murmur heard. Pulmonary:     Effort: Pulmonary effort is normal.     Breath sounds: Normal breath sounds.  Abdominal:     Palpations: Abdomen is soft.     Tenderness: There is no abdominal tenderness. There is no guarding or rebound.  Musculoskeletal:        General: Normal range of motion.     Cervical back: Normal range of motion.  Lymphadenopathy:     Cervical: No cervical adenopathy.  Neurological:     General: No focal deficit present.     Mental Status: She is alert and oriented to person, place, and time.     Cranial Nerves: No cranial nerve deficit.  Skin:    General: Skin is warm and dry.  Psychiatric:        Mood and Affect: Mood normal.        Behavior: Behavior normal.        Thought Content: Thought content normal.        Judgment: Judgment normal.  Vitals reviewed.     Assessment/Plan:  Encounter for annual routine gynecological examination  Encounter for surveillance of contraceptive pills--pt to stop pills for now to see what cycles do. Can restart prn. F/u prn AUB.   Encounter for screening mammogram for malignant neoplasm of breast - Plan: MM 3D SCREENING MAMMOGRAM BILATERAL BREAST; pt current on mammo till 4/26  Recurrent vaginitis - Plan: fluconazole  (DIFLUCAN ) 150 MG tablet; Rx RF, has occas sx  Herpes simplex vulvovaginitis - Plan: valACYclovir  (VALTREX ) 500 MG tablet; Rx RF eRxd.   Meds ordered this encounter  Medications   fluconazole  (DIFLUCAN ) 150 MG tablet    Sig: Take 1 tablet (150 mg total) by mouth daily.    Dispense:  1 tablet    Refill:  2    Supervising Provider:   ROBY, MICIA [[id number]]   valACYclovir  (VALTREX ) 500 MG tablet    Sig: Take 1  tablet (500 mg total) by mouth daily.    Dispense:  90 tablet    Refill:  3    Supervising Provider:   ROBY, MICIA [[id number]]          GYN counsel breast self exam, mammography screening, menopause, adequate intake of calcium and vitamin D, diet and exercise    F/U  Return in about 1 year  (around 09/02/2025).  Rece Zechman B. Sirron Francesconi, PA-C 09/02/2024 10:58 AM

## 2024-09-02 NOTE — Patient Instructions (Addendum)
 I value your feedback and you entrusting [place] with your care. If you get a [facility] patient survey, I would appreciate you taking the time to let us know about your experience today. Thank you!  [facility] ([place])--[phone]
